# Patient Record
Sex: Male | Born: 1975 | Race: White | Hispanic: No | Marital: Married | Smoking: Current some day smoker
Health system: Southern US, Community
[De-identification: ages and names within clinical notes are randomized; demographics above are authoritative.]

## PROBLEM LIST (undated history)

## (undated) DIAGNOSIS — K219 Gastro-esophageal reflux disease without esophagitis: Secondary | ICD-10-CM

## (undated) DIAGNOSIS — B1920 Unspecified viral hepatitis C without hepatic coma: Secondary | ICD-10-CM

## (undated) HISTORY — DX: Unspecified viral hepatitis C without hepatic coma: B19.20

## (undated) HISTORY — PX: FINGER SURGERY: SHX640

---

## 2014-11-14 DIAGNOSIS — B1920 Unspecified viral hepatitis C without hepatic coma: Secondary | ICD-10-CM

## 2014-11-14 HISTORY — DX: Unspecified viral hepatitis C without hepatic coma: B19.20

## 2014-12-06 ENCOUNTER — Other Ambulatory Visit (HOSPITAL_COMMUNITY)
Admission: RE | Admit: 2014-12-06 | Discharge: 2014-12-06 | Disposition: A | Discharge status: Home / Self Care | Payer: Self-pay | Source: Ambulatory Visit | Attending: Gastroenterology | Admitting: Gastroenterology

## 2014-12-06 ENCOUNTER — Inpatient Hospital Stay (HOSPITAL_COMMUNITY)
Admission: AD | Admit: 2014-12-06 | Discharge: 2014-12-08 | DRG: 442 | Disposition: A | Discharge status: Home / Self Care | Payer: Self-pay | Source: Ambulatory Visit | Attending: Internal Medicine | Admitting: Internal Medicine

## 2014-12-06 ENCOUNTER — Ambulatory Visit (HOSPITAL_COMMUNITY)
Admission: RE | Admit: 2014-12-06 | Discharge: 2014-12-06 | Disposition: A | Discharge status: Home / Self Care | Payer: Self-pay | Source: Ambulatory Visit | Attending: Gastroenterology | Admitting: Gastroenterology

## 2014-12-06 ENCOUNTER — Ambulatory Visit (INDEPENDENT_AMBULATORY_CARE_PROVIDER_SITE_OTHER): Payer: Self-pay | Admitting: Gastroenterology

## 2014-12-06 ENCOUNTER — Encounter: Payer: Self-pay | Admitting: Gastroenterology

## 2014-12-06 VITALS — BP 103/68 | HR 56 | Temp 97.6°F | Ht 70.0 in | Wt 137.6 lb

## 2014-12-06 DIAGNOSIS — B1710 Acute hepatitis C without hepatic coma: Secondary | ICD-10-CM | POA: Insufficient documentation

## 2014-12-06 DIAGNOSIS — R7989 Other specified abnormal findings of blood chemistry: Secondary | ICD-10-CM

## 2014-12-06 DIAGNOSIS — R634 Abnormal weight loss: Secondary | ICD-10-CM | POA: Diagnosis present

## 2014-12-06 DIAGNOSIS — R109 Unspecified abdominal pain: Secondary | ICD-10-CM | POA: Diagnosis present

## 2014-12-06 DIAGNOSIS — R945 Abnormal results of liver function studies: Secondary | ICD-10-CM

## 2014-12-06 DIAGNOSIS — R1084 Generalized abdominal pain: Secondary | ICD-10-CM

## 2014-12-06 DIAGNOSIS — R17 Unspecified jaundice: Secondary | ICD-10-CM | POA: Insufficient documentation

## 2014-12-06 DIAGNOSIS — Z681 Body mass index (BMI) 19 or less, adult: Secondary | ICD-10-CM

## 2014-12-06 DIAGNOSIS — G8929 Other chronic pain: Secondary | ICD-10-CM | POA: Diagnosis present

## 2014-12-06 DIAGNOSIS — R1011 Right upper quadrant pain: Secondary | ICD-10-CM | POA: Insufficient documentation

## 2014-12-06 DIAGNOSIS — F17210 Nicotine dependence, cigarettes, uncomplicated: Secondary | ICD-10-CM | POA: Diagnosis present

## 2014-12-06 DIAGNOSIS — R1013 Epigastric pain: Secondary | ICD-10-CM

## 2014-12-06 LAB — HEPATIC FUNCTION PANEL
ALT: 1225 U/L — ABNORMAL HIGH (ref 0–53)
ALT: 1225 U/L — ABNORMAL HIGH (ref 0–53)
AST: 1015 U/L — ABNORMAL HIGH (ref 0–37)
AST: 1015 U/L — ABNORMAL HIGH (ref 0–37)
Albumin: 3.4 g/dL — ABNORMAL LOW (ref 3.5–5.2)
Albumin: 3.4 g/dL — ABNORMAL LOW (ref 3.5–5.2)
Alkaline Phosphatase: 573 U/L — ABNORMAL HIGH (ref 39–117)
Alkaline Phosphatase: 573 U/L — ABNORMAL HIGH (ref 39–117)
BILIRUBIN DIRECT: 9.2 mg/dL — AB (ref 0.0–0.5)
BILIRUBIN TOTAL: 15.6 mg/dL — AB (ref 0.3–1.2)
BILIRUBIN TOTAL: 15.6 mg/dL — AB (ref 0.3–1.2)
Bilirubin, Direct: 9.2 mg/dL — ABNORMAL HIGH (ref 0.0–0.3)
Indirect Bilirubin: 6.4 mg/dL — ABNORMAL HIGH (ref 0.3–0.9)
Indirect Bilirubin: 6.4 mg/dL — ABNORMAL HIGH (ref 0.2–1.2)
Total Protein: 7 g/dL (ref 6.0–8.3)
Total Protein: 7 g/dL (ref 6.0–8.3)

## 2014-12-06 LAB — CBC WITH DIFFERENTIAL/PLATELET
BASOS ABS: 0.5 10*3/uL — AB (ref 0.0–0.1)
BASOS PCT: 3 % — AB (ref 0–1)
Basophils Absolute: 0.5 10*3/uL — ABNORMAL HIGH (ref 0.0–0.1)
Basophils Relative: 3 % — ABNORMAL HIGH (ref 0–1)
EOS ABS: 1.4 10*3/uL — AB (ref 0.0–0.7)
EOS ABS: 1.4 10*3/uL — AB (ref 0.0–0.7)
Eosinophils Relative: 9 % — ABNORMAL HIGH (ref 0–5)
Eosinophils Relative: 9 % — ABNORMAL HIGH (ref 0–5)
HCT: 45.3 % (ref 39.0–52.0)
HCT: 45.3 % (ref 39.0–52.0)
HEMOGLOBIN: 15.8 g/dL (ref 13.0–17.0)
Hemoglobin: 15.8 g/dL (ref 13.0–17.0)
LYMPHS PCT: 36 % (ref 12–46)
Lymphocytes Relative: 36 % (ref 12–46)
Lymphs Abs: 5.6 10*3/uL — ABNORMAL HIGH (ref 0.7–4.0)
Lymphs Abs: 5.6 10*3/uL — ABNORMAL HIGH (ref 0.7–4.0)
MCH: 33.5 pg (ref 26.0–34.0)
MCH: 33.5 pg (ref 26.0–34.0)
MCHC: 34.9 g/dL (ref 30.0–36.0)
MCHC: 34.9 g/dL (ref 30.0–36.0)
MCV: 96 fL (ref 78.0–100.0)
MCV: 96 fL (ref 78.0–100.0)
MONO ABS: 1.4 10*3/uL — AB (ref 0.1–1.0)
Monocytes Absolute: 1.4 10*3/uL — ABNORMAL HIGH (ref 0.1–1.0)
Monocytes Relative: 9 % (ref 3–12)
Monocytes Relative: 9 % (ref 3–12)
Neutro Abs: 6.6 10*3/uL (ref 1.7–7.7)
Neutro Abs: 6.7 10*3/uL (ref 1.7–7.7)
Neutrophils Relative %: 43 % (ref 43–77)
Neutrophils Relative %: 43 % (ref 43–77)
PLATELETS: 307 10*3/uL (ref 150–400)
Platelets: 307 10*3/uL (ref 150–400)
RBC: 4.72 MIL/uL (ref 4.22–5.81)
RBC: 4.72 MIL/uL (ref 4.22–5.81)
RDW: 13.5 % (ref 11.5–15.5)
RDW: 13.5 % (ref 11.5–15.5)
WBC: 15.5 10*3/uL — AB (ref 4.0–10.5)
WBC: 15.5 10*3/uL — ABNORMAL HIGH (ref 4.0–10.5)

## 2014-12-06 LAB — CREATININE, SERUM: Creatinine, Ser: 0.6 mg/dL (ref 0.50–1.35)

## 2014-12-06 LAB — PROTIME-INR
INR: 1.05 (ref 0.00–1.49)
INR: 1.05 (ref <1.50)
PROTHROMBIN TIME: 13.8 s (ref 11.6–15.2)
PROTHROMBIN TIME: 13.8 s (ref 11.6–15.2)

## 2014-12-06 MED ORDER — IOHEXOL 300 MG/ML  SOLN
100.0000 mL | Freq: Once | INTRAMUSCULAR | Status: AC | PRN
  Administered 2014-12-06: 100 mL via INTRAVENOUS

## 2014-12-06 MED ORDER — HEPARIN SODIUM (PORCINE) 5000 UNIT/ML IJ SOLN
5000.0000 [IU] | Freq: Three times a day (TID) | INTRAMUSCULAR | Status: DC
  Administered 2014-12-06 – 2014-12-08 (×4): 5000 [IU] via SUBCUTANEOUS
  Filled 2014-12-06 (×4): qty 1

## 2014-12-06 MED ORDER — ONDANSETRON HCL 4 MG PO TABS
4.0000 mg | ORAL_TABLET | Freq: Four times a day (QID) | ORAL | Status: DC | PRN
  Administered 2014-12-07: 4 mg via ORAL
  Filled 2014-12-06 (×2): qty 1

## 2014-12-06 MED ORDER — TRAMADOL HCL 50 MG PO TABS
50.0000 mg | ORAL_TABLET | Freq: Four times a day (QID) | ORAL | Status: DC | PRN
  Administered 2014-12-06 – 2014-12-07 (×2): 50 mg via ORAL
  Filled 2014-12-06 (×2): qty 1

## 2014-12-06 MED ORDER — ONDANSETRON HCL 4 MG/2ML IJ SOLN
4.0000 mg | Freq: Four times a day (QID) | INTRAMUSCULAR | Status: DC | PRN
  Administered 2014-12-06 – 2014-12-08 (×4): 4 mg via INTRAVENOUS
  Filled 2014-12-06 (×4): qty 2

## 2014-12-06 MED ORDER — SODIUM CHLORIDE 0.9 % IJ SOLN
3.0000 mL | Freq: Two times a day (BID) | INTRAMUSCULAR | Status: DC
  Administered 2014-12-06 – 2014-12-08 (×3): 3 mL via INTRAVENOUS

## 2014-12-06 NOTE — Assessment & Plan Note (Addendum)
39 year old male with chronic generalized abdominal pain, early satiety, nausea, and associated weight loss, with findings of significantly elevated transaminases and Tbili 4.2 several days ago. No imaging performed by referring provider. No known history of liver disease. With risk factors for viral hepatitis, need to check Hep C antibody, Hep BsAg, HIV, CBC, repeat HFP. Needs CT abd with contrast stat today. May have underlying liver disease due to chronic ETOH abuse. Clinical picture not entirely clear until imaging completed. Start Nexium due to chronic GERD.   As of note, low-volume hematochezia intermittently will need further work-up via colonoscopy in the future. Address current issues first.

## 2014-12-06 NOTE — H&P (Signed)
[facility] History and Physical  Anthony Larson RUE:[id number] DOB: November 03, 1975 DOA: 12/06/2014  Referring physician: Gerrit Halls, NP. PCP: Pcp Not In System   Chief Complaint: Abdominal pain, nausea.  HPI: Anthony Larson is a 39 y.o. male  This is a 39 year old man who has been admitted directly by the gastroenterology team. He apparently has had lower abdominal pain for approximately 2 years with early satiety. He has associated nausea. He denies any significant vomiting, hematemesis. He has noticed dark urine. There is no diarrhea but stools are somewhat changed in color. He occasionally has had rectal bleeding. Overall, his symptoms of present for between one to 2 years. Approximately 5 years ago, he started to drink heavily alcohol, a bottle of vodka per day, but then he quit drinking 2 years ago. He has no history of IV drug abuse. He has several tattoos. There has been no diarrhea, fever. He has been investigated in the gastroenterology office and he is now being admitted because his liver enzymes are significantly elevated and his bilirubin is also elevated at 15.6. This is an increase from when blood work was done at an urgent care center recently. A CT scan has also been done which does not show any major abnormalities but there is some periportal edema and some small lymph nodes in the porta hepatis. He is now being admitted for further investigation.   Review of Systems:  Apart from symptoms above, all systems negative.  No past medical history on file. Past Surgical History  Procedure Laterality Date  . Finger surgery      left hand middle finger   Social History:  reports that he has been smoking Cigarettes.  He has been smoking about 0.50 packs per day. He does not have any smokeless tobacco history on file. He reports that he drinks alcohol. He reports that he uses illicit drugs.  No Known Allergies  Family History  Problem Relation Age of Onset  . Prostate cancer Father     . Colon cancer Neg Hx   . Throat cancer Mother       Prior to Admission medications   Medication Sig Start Date End Date Taking? Authorizing Provider  traMADol (ULTRAM) 50 MG tablet Take 50 mg by mouth every 6 (six) hours as needed.    Historical Provider, MD   Physical Exam: Filed Vitals:   12/06/14 1900  BP: 98/58  Pulse: 68  Temp: 99.4 F (37.4 C)  TempSrc: Oral  Resp: 18  Height:  (1.778 m)  Weight: 60 kg (132 lb 4.4 oz)  SpO2: 97%    Wt Readings from Last 3 Encounters:  12/06/14 60 kg (132 lb 4.4 oz)  12/06/14 62.415 kg (137 lb 9.6 oz)    General:  Appears jaundice. There is no clubbing. Eyes: PERRL, normal lids, irises & conjunctiva ENT: grossly normal hearing, lips & tongue Neck: no LAD, masses or thyromegaly Cardiovascular: RRR, no m/r/g. No LE edema. Telemetry: SR, no arrhythmias  Respiratory: CTA bilaterally, no w/r/r. Normal respiratory effort. Abdomen: soft, ntnd. No masses felt. No hepatomegaly. No splenomegaly. Skin: no rash or induration seen on limited exam Musculoskeletal: grossly normal tone BUE/BLE Psychiatric: grossly normal mood and affect, speech fluent and appropriate Neurologic: grossly non-focal.          Labs on Admission:  Basic Metabolic Panel: No results for input(s): NA, K, CL, CO2, GLUCOSE, BUN, CREATININE, CALCIUM, MG, PHOS in the last 168 hours. Liver Function Tests:  Recent Labs Lab 12/06/14 1200  12/06/14 1330  AST 1015* 1015*  ALT 1225* 1225*  ALKPHOS 573* 573*  BILITOT 15.6* 15.6*  PROT 7.0 7.0  ALBUMIN 3.4* 3.4*   No results for input(s): LIPASE, AMYLASE in the last 168 hours. No results for input(s): AMMONIA in the last 168 hours. CBC:  Recent Labs Lab 12/06/14 1200 12/06/14 1330  WBC 15.5* 15.5*  NEUTROABS 6.7 6.6  HGB 15.8 15.8  HCT 45.3 45.3  MCV 96.0 96.0  PLT 307 307   Cardiac Enzymes: No results for input(s): CKTOTAL, CKMB, CKMBINDEX, TROPONINI in the last 168 hours.  BNP (last 3  results) No results for input(s): BNP in the last 8760 hours.  ProBNP (last 3 results) No results for input(s): PROBNP in the last 8760 hours.  CBG: No results for input(s): GLUCAP in the last 168 hours.  Radiological Exams on Admission: Ct Abdomen Pelvis W Contrast  12/06/2014   CLINICAL DATA:  Elevated liver function tests, abdominal pain, jaundice  EXAM: CT ABDOMEN AND PELVIS WITH CONTRAST  TECHNIQUE: Multidetector CT imaging of the abdomen and pelvis was performed using the standard protocol following bolus administration of intravenous contrast.  CONTRAST:  100mL OMNIPAQUE IOHEXOL 300 MG/ML  SOLN  COMPARISON:  None.  FINDINGS: The lung bases are clear. The liver enhances with no focal abnormality and no ductal dilatation is seen. There is however some periportal edema this is a nonspecific finding but can be seen with hepatitis, congestive heart failure, or lymphatic obstruction in the porta hepatis. The portal vein appears to opacified. No focal hepatic abnormality is seen. The gallbladder is contracted and no calcified gallstones are seen. The pancreas appears normal in size and the pancreatic duct is not dilated. The peripancreatic fat planes are relatively well preserved with no evidence of pancreatitis. The adrenal glands and spleen are unremarkable. The stomach is filled with food debris. The kidneys enhance with no calculus or mass and no hydronephrosis is seen. On delayed images the pelvocaliceal systems are unremarkable and the proximal ureters are normal in caliber. The abdominal aorta is normal in caliber. A few lymph nodes are present in the epigastrium and porta hepatis which could be reactive.  The urinary bladder is decompressed and appears slightly thick walled. The prostate is within normal limits in size. The terminal ileum and the appendix are unremarkable. There is degenerative disc disease at the L5-S1 level noted.  IMPRESSION: 1. Periportal edema which is a nonspecific finding,  but can be seen with hepatitis, congestive heart failure, or obstruction to the lymphatic drainage from the porta hepatis by adenopathy. Correlate clinically. 2. Small nodes in the porta hepatis and epigastrium could be reactive. 3. No gallstones are evident. 4. Degenerative disc disease at L5-S1   Electronically Signed   By: Dwyane DeePaul  Barry M.D.   On: 12/06/2014 16:34    Assessment/Plan Active Problems:   Elevated LFTs   Abdominal pain   Abnormal LFTs   1. Abnormal liver function tests. He has jaundice which does not appear to be obstructive in nature and the liver enzymes point towards a hepatitic picture overall. Blood has been taking for hepatitis virology and HIV. I will add ferritin levels. He may eventually need a liver biopsy for further diagnosis. I appreciate gastroenterology consultation.  Further recommendations will depend on patient's hospital progress.   Code Status: Full code.   DVT Prophylaxis: Heparin.  Family Communication: I discussed the plan with the patient at the bedside.   Disposition Plan: Home when medically stable.  Time spent:  60 minutes.  Wilson SingerGOSRANI,NIMISH C [facility] Pager [phone].

## 2014-12-06 NOTE — Progress Notes (Signed)
No pcp per patient 

## 2014-12-06 NOTE — Progress Notes (Signed)
Primary Care Physician:  Pcp Not In System Primary Gastroenterologist:  Dr. Darrick Penna   Chief Complaint  Patient presents with  . Abdominal Pain  . Nausea    HPI:   Anthony Larson is a 39 y.o. male presenting today at the request of [facility]. He was seen on 11/29/14 and found to have ALT 691, AST 567, Tbili 4.2. No imaging studies were done.   Has had pain from neck down to lower abdomen for "years". Notes early satiety. Associated nausea. Feels like it is sitting heavy and just won't go "any further". Notes itching. Urine tea-colored, starting a few weeks ago. BMs look like "whatever you ate going out the same way". No significant changes in bowel habits. Notes occasional bright red blood in stool, sometimes dark/burgundy. Present for about a year. +severe reflux.   Notes epigastric burning. Constant. Sometimes worse than others. Sometimes worsened with eating, certain ways he sits. Used to weigh 143 about a week ago, now down to 137. Baseline weight 150 or 160.   History of chronic ETOH abuse. Daily ETOH use in the past. Could drink a bottle of vodka in a day. Would drink until blacking out. No IV drug use. Has several tattoos, friend did it with tattoo gun but not in a tattoo shop. Last time he drank about a month ago but was just 2 beers.    No past medical history on file.  Past Surgical History  Procedure Laterality Date  . Finger surgery      left hand middle finger    Current Outpatient Prescriptions  Medication Sig Dispense Refill  . traMADol (ULTRAM) 50 MG tablet Take 50 mg by mouth every 6 (six) hours as needed.     No current facility-administered medications for this visit.    Allergies as of 12/06/2014  . (No Known Allergies)    Family History  Problem Relation Age of Onset  . Prostate cancer Father   . Colon cancer Neg Hx   . Throat cancer Mother     History   Social History  . Marital Status: Unknown    Spouse Name: N/A  . Number of  Children: N/A  . Years of Education: N/A   Occupational History  . Not on file.   Social History Main Topics  . Smoking status: Current Some Day Smoker -- 0.50 packs/day    Types: Cigarettes  . Smokeless tobacco: Not on file  . Alcohol Use: 0.0 oz/week    0 Standard drinks or equivalent per week     Comment: history of ETOH abuse  . Drug Use: Yes     Comment: marijuana, no IV drug use  . Sexual Activity: Not on file   Other Topics Concern  . Not on file   Social History Narrative  . No narrative on file    Review of Systems: As mentioned in HPI  Physical Exam: BP 103/68 mmHg  Pulse 56  Temp(Src) 97.6 F (36.4 C) (Oral)  Ht  (1.778 m)  Wt 137 lb 9.6 oz (62.415 kg)  BMI 19.74 kg/m2 General:   Alert and oriented. Jaundiced. Appears cachectic. Sunken cheeks. Chronically-ill appearing.  Head:  Normocephalic and atraumatic. Eyes:  +scleral icterus  Ears:  Normal auditory acuity. Nose:  No deformity, discharge,  or lesions. Mouth:  No deformity or lesions, oral mucosa pink.  Lungs:  Clear to auscultation bilaterally. No wheezes, rales, or rhonchi. No distress.  Heart:  S1, S2 present  without murmurs appreciated.  Abdomen:  +BS, soft, non-tender and non-distended. No HSM noted. No guarding or rebound. No masses appreciated.  Rectal:  Deferred  Msk:  Symmetrical without gross deformities. Normal posture. Extremities:  Without  edema. Neurologic:  Alert and  oriented x4;  grossly normal neurologically. Negative asterixis  Skin:  Intact without significant lesions or rashes. Psych:  Alert and cooperative. Normal mood and affect.   Outside labs from 11/29/14: AST 567, ALT 691, Tbili 4.2. NO IMAGING COMPLETED.

## 2014-12-06 NOTE — Progress Notes (Signed)
Quick Note:  Reviewed blood work. Transaminases now in the 1000s, bilirubin 15.6. INR normal. Mixed pattern elevated LFTs. Hep C, Hep B, HIV pending. May ultimately need further serologies. CT with periportal edema. Discussed briefly with Dr. Darrick PennaFields. As LFTs worsening, needs admission for observation. Contacted patient, [place]. Reports abdominal pain, afebrile, willing to come for admission. Discussed with Dr. Karilyn CotaGosrani, who will be admitting hospitalist. He asked that the nursing staff contact him upon arrival. I contacted the 3rd floor and spoke with the unit secretary, who will let the nursing staff know. Patient was contacted via his cell phone and instructed to present to the [facility] admissions. ______

## 2014-12-06 NOTE — Patient Instructions (Signed)
Start taking the Nexium samples once daily, 30 minutes before breakfast.   I would like to get a CT today. We will call with the results.   Please have blood work done as well.  At some point, you will need a colonoscopy due to history of rectal bleeding. You may need an upper endoscopy depending on the findings from the imaging studies.

## 2014-12-07 DIAGNOSIS — B1710 Acute hepatitis C without hepatic coma: Principal | ICD-10-CM | POA: Insufficient documentation

## 2014-12-07 DIAGNOSIS — G8929 Other chronic pain: Secondary | ICD-10-CM

## 2014-12-07 DIAGNOSIS — R1011 Right upper quadrant pain: Secondary | ICD-10-CM

## 2014-12-07 DIAGNOSIS — R1013 Epigastric pain: Secondary | ICD-10-CM

## 2014-12-07 LAB — COMPREHENSIVE METABOLIC PANEL
ALK PHOS: 517 U/L — AB (ref 39–117)
ALT: 1126 U/L — AB (ref 0–53)
AST: 887 U/L — AB (ref 0–37)
Albumin: 3.1 g/dL — ABNORMAL LOW (ref 3.5–5.2)
Anion gap: 7 (ref 5–15)
BUN: 9 mg/dL (ref 6–23)
CO2: 30 mmol/L (ref 19–32)
Calcium: 8.8 mg/dL (ref 8.4–10.5)
Chloride: 100 mmol/L (ref 96–112)
Creatinine, Ser: 0.61 mg/dL (ref 0.50–1.35)
GLUCOSE: 102 mg/dL — AB (ref 70–99)
POTASSIUM: 4.1 mmol/L (ref 3.5–5.1)
Sodium: 137 mmol/L (ref 135–145)
Total Bilirubin: 13 mg/dL — ABNORMAL HIGH (ref 0.3–1.2)
Total Protein: 6.3 g/dL (ref 6.0–8.3)

## 2014-12-07 LAB — HEPATITIS A ANTIBODY, TOTAL: Hep A Total Ab: NONREACTIVE

## 2014-12-07 LAB — ACETAMINOPHEN LEVEL

## 2014-12-07 LAB — HIV ANTIBODY (ROUTINE TESTING W REFLEX)
HIV 1&2 Ab, 4th Generation: NONREACTIVE
HIV SCREEN 4TH GENERATION: NONREACTIVE

## 2014-12-07 LAB — PROTIME-INR
INR: 1.1 (ref 0.00–1.49)
PROTHROMBIN TIME: 14.3 s (ref 11.6–15.2)

## 2014-12-07 LAB — HEPATITIS B SURFACE ANTIGEN
HEP B S AG: NEGATIVE
Hepatitis B Surface Ag: NEGATIVE

## 2014-12-07 LAB — HEPATITIS A ANTIBODY, IGM: HEP A IGM: NONREACTIVE

## 2014-12-07 LAB — HEPATITIS C RNA QUANTITATIVE

## 2014-12-07 LAB — FERRITIN: Ferritin: 1212 ng/mL — ABNORMAL HIGH (ref 22–322)

## 2014-12-07 LAB — HEPATITIS C ANTIBODY
HCV Ab: REACTIVE — AB
HCV Ab: REACTIVE — AB

## 2014-12-07 MED ORDER — PANTOPRAZOLE SODIUM 40 MG PO TBEC
40.0000 mg | DELAYED_RELEASE_TABLET | Freq: Every day | ORAL | Status: DC
  Administered 2014-12-07 – 2014-12-08 (×2): 40 mg via ORAL
  Filled 2014-12-07 (×2): qty 1

## 2014-12-07 MED ORDER — HYDROXYZINE HCL 25 MG PO TABS
25.0000 mg | ORAL_TABLET | Freq: Three times a day (TID) | ORAL | Status: DC | PRN
Start: 2014-12-07 — End: 2014-12-08

## 2014-12-07 MED ORDER — SODIUM CHLORIDE 0.9 % IV SOLN
INTRAVENOUS | Status: DC
  Administered 2014-12-07 – 2014-12-08 (×2): via INTRAVENOUS

## 2014-12-07 MED ORDER — HYDROMORPHONE HCL 1 MG/ML IJ SOLN
1.0000 mg | INTRAMUSCULAR | Status: DC | PRN
  Administered 2014-12-07 – 2014-12-08 (×7): 1 mg via INTRAVENOUS
  Filled 2014-12-07 (×7): qty 1

## 2014-12-07 NOTE — Care Management Note (Addendum)
    Page 1 of 1   12/08/2014     11:23:49 AM CARE MANAGEMENT NOTE 12/08/2014  Patient:  Anthony EatonBRANCH,Anthony Larson   Account Number:  [id number]  Date Initiated:  12/07/2014  Documentation initiated by:  Kathyrn SheriffHILDRESS,JESSICA  Subjective/Objective Assessment:   Pt is from home, independnet at baseline. Pt is uninsured and unemployeed. Pt has children living in the home. Pt has no PCP. Pt admitted for hepatitis and abd pain.     Action/Plan:   Pt plans to discharge home with self care. Referral made to financial counselor. [facility]chr[facility]. Will f/u with GI also. Will cont to follow for CM needs.   Anticipated DC Date:  12/08/2014   Anticipated DC Plan:  HOME/SELF CARE  In-house referral  Financial Counselor      DC Planning Services  CM consult      Choice offered to / List presented to:             Status of service:  Completed, signed off Medicare Important Message given?   (If response is "NO", the following Medicare IM given date fields will be blank) Date Medicare IM given:   Medicare IM given by:   Date Additional Medicare IM given:   Additional Medicare IM given by:    Discharge Disposition:  HOME/SELF CARE  Per UR Regulation:  Reviewed for med. necessity/level of care/duration of stay  If discussed at Long Length of Stay Meetings, dates discussed:    Comments:  12/08/2014 1100 Kathyrn SheriffJessica Childress, RN, MSN, CM Pt plans for discharge home today. [place] [facility] [place] Says pt needs to come in (no appointment needed) and fill out work eligability paperwork and appointment will be made. Pt given phone number and address. Pt states he will go. Financial counselor consulted and has visited with pt. No further CM needs.  12/07/2014 1500 Kathyrn SheriffJessica Childress, RN, MSN, CM

## 2014-12-07 NOTE — Progress Notes (Addendum)
Subjective:  Complains of chronic back pain and chronic abdominal pain. Pain currently not managed on Ultram. Previously on chronic narcotics prescribed but hasn't been seen by physician in awhile. More recently had been taking a friends Roxicet. Undisclosed amount. Also known to take a significant amount of BCs/Goodys as well. Took a box in the last few weeks. Chronic postprandial abdominal pain now worse in the last several weeks. No melena, brbpr, vomiting. Admits to tattoos not done in a facility, but the newest one was done 3 years ago. He has snorted within the past few months and shared. No IV drug use. Multiple close contacts have had Hep C.   Objective: Vital signs in last 24 hours: Temp:  [97.6 F (36.4 C)-99.4 F (37.4 C)] 98.4 F (36.9 C) (02/24 0546) Pulse Rate:  [53-68] 53 (02/24 0546) Resp:  [18] 18 (02/24 0546) BP: (95-103)/(49-68) 95/49 mmHg (02/24 0546) SpO2:  [97 %] 97 % (02/24 0546) Weight:  [132 lb 4.4 oz (60 kg)-137 lb 9.6 oz (62.415 kg)] 132 lb 4.4 oz (60 kg) (02/23 1900) Last BM Date: 12/06/14 General:   Alert,  Well-developed, well-nourished, pleasant and cooperative in NAD Head:  Normocephalic and atraumatic. Eyes:  Sclera clear, + icterus.  Abdomen:  Soft, moderate epigastric/ruq tenderness and nondistended.  Extremities:  Without clubbing, deformity or edema. Neurologic:  Alert and  oriented x4;  grossly normal neurologically. Skin:  Intact without significant lesions or rashes.+jaundice Psych:  Alert and cooperative. Normal mood and affect.  Intake/Output from previous day:   Intake/Output this shift:    Lab Results: CBC  Recent Labs  12/06/14 1200 12/06/14 1330  WBC 15.5* 15.5*  HGB 15.8 15.8  HCT 45.3 45.3  MCV 96.0 96.0  PLT 307 307   BMET  Recent Labs  12/06/14 2032 12/07/14 0547  NA  --  137  K  --  4.1  CL  --  100  CO2  --  30  GLUCOSE  --  102*  BUN  --  9  CREATININE 0.60 0.61  CALCIUM  --  8.8   LFTs  Recent Labs   12/06/14 1200 12/06/14 1330 12/07/14 0547  BILITOT 15.6* 15.6* 13.0*  BILIDIR 9.2* 9.2*  --   IBILI 6.4* 6.4*  --   ALKPHOS 573* 573* 517*  AST 1015* 1015* 887*  ALT 1225* 1225* 1126*  PROT 7.0 7.0 6.3  ALBUMIN 3.4* 3.4* 3.1*   No results for input(s): LIPASE in the last 72 hours. PT/INR  Recent Labs  12/06/14 1200 12/06/14 1330 12/07/14 0547  LABPROT 13.8 13.8 14.3  INR 1.05 1.05 1.10      Imaging Studies: Ct Abdomen Pelvis W Contrast  12/06/2014   CLINICAL DATA:  Elevated liver function tests, abdominal pain, jaundice  EXAM: CT ABDOMEN AND PELVIS WITH CONTRAST  TECHNIQUE: Multidetector CT imaging of the abdomen and pelvis was performed using the standard protocol following bolus administration of intravenous contrast.  CONTRAST:  100mL OMNIPAQUE IOHEXOL 300 MG/ML  SOLN  COMPARISON:  None.  FINDINGS: The lung bases are clear. The liver enhances with no focal abnormality and no ductal dilatation is seen. There is however some periportal edema this is a nonspecific finding but can be seen with hepatitis, congestive heart failure, or lymphatic obstruction in the porta hepatis. The portal vein appears to opacified. No focal hepatic abnormality is seen. The gallbladder is contracted and no calcified gallstones are seen. The pancreas appears normal in size and the pancreatic duct is not dilated.  The peripancreatic fat planes are relatively well preserved with no evidence of pancreatitis. The adrenal glands and spleen are unremarkable. The stomach is filled with food debris. The kidneys enhance with no calculus or mass and no hydronephrosis is seen. On delayed images the pelvocaliceal systems are unremarkable and the proximal ureters are normal in caliber. The abdominal aorta is normal in caliber. A few lymph nodes are present in the epigastrium and porta hepatis which could be reactive.  The urinary bladder is decompressed and appears slightly thick walled. The prostate is within normal limits  in size. The terminal ileum and the appendix are unremarkable. There is degenerative disc disease at the L5-S1 level noted.  IMPRESSION: 1. Periportal edema which is a nonspecific finding, but can be seen with hepatitis, congestive heart failure, or obstruction to the lymphatic drainage from the porta hepatis by adenopathy. Correlate clinically. 2. Small nodes in the porta hepatis and epigastrium could be reactive. 3. No gallstones are evident. 4. Degenerative disc disease at L5-S1   Electronically Signed   By: Dwyane Dee M.D.   On: 12/06/2014 16:34  [2 weeks]   Assessment: 39 y/o male with generalizes abdominal pain, early satiety, nausea, weight loss with significantly abnormal LFTs. CT with nonspecific periportal edema. H/O etoh abuse but none in couple of months at least. +risk factors for acute viral hepatitis. +HCV Antibody but no baseline to determine if acute or chronic. LFTs have shown some trend downward. Patient with oxycodone/acetaminophen ingestion but unclear quantities.   Chronic abdominal pain in setting of significant ASA powder use. Cannot exclude PUD.    Plan: 1. Check acetaminophen levels.  2. Pain management addressed acutely this morning but patient aware that hospitalist will be managing during his stay. 3. Await pending labs.  4. Consider EGD as outpatient if ongoing epigastric pain. Will start PPI therapy.   Leanna Battles. Dixon Boos [facility] [phone] 2/24/20169:44 AM     LOS: 1 day    Attending note:  Acetaminophen level less than 10. Discussed with Dr. Kerry Hough.  Viral markers pending. He does not appear to be running a fulminant course. Once he is eating better, he can probably be discharged.

## 2014-12-07 NOTE — Care Management Utilization Note (Signed)
UR completed 

## 2014-12-07 NOTE — Progress Notes (Signed)
[facility] PROGRESS NOTE  Anthony Larson ZOX:[id number] DOB: 1976/05/13 DOA: 12/06/2014 PCP: Pcp Not In System  Assessment/Plan: 1. Elevated liver enzymes. Possibly related to viral hepatitis. Hepatitis C antibody is positive. Treatment at this time would be supportive. His liver enzymes appear to be trending down. Continue with IV fluids and supportive treatment. Acetaminophen and salicylate levels were negative. He will need outpatient GI follow-up for further workup/treatment. 2. Chronic abdominal pain. Continue supportive management.  Code Status: full code Family Communication: discussed with patient Disposition Plan: discharge home once tolerating po diet   Consultants:  gastroenterology  Procedures:    Antibiotics:    HPI/Subjective: Still feeling nauseous and having abdominal pain. No vomiting  Objective: Filed Vitals:   12/07/14 1514  BP: 94/53  Pulse: 46  Temp: 98.3 F (36.8 C)  Resp: 18    Intake/Output Summary (Last 24 hours) at 12/07/14 1819 Last data filed at 12/07/14 1330  Gross per 24 hour  Intake    240 ml  Output      0 ml  Net    240 ml   Filed Weights   12/06/14 1900  Weight: 60 kg (132 lb 4.4 oz)    Exam:   General:  NAD  Cardiovascular: S1, S2 RRR  Respiratory: CTA B  Abdomen: soft, epigastric tenderness, bs+  Musculoskeletal: no edema b/l   Data Reviewed: Basic Metabolic Panel:  Recent Labs Lab 12/06/14 2032 12/07/14 0547  NA  --  137  K  --  4.1  CL  --  100  CO2  --  30  GLUCOSE  --  102*  BUN  --  9  CREATININE 0.60 0.61  CALCIUM  --  8.8   Liver Function Tests:  Recent Labs Lab 12/06/14 12/06/14 1330 12/07/14 0547  AST 1015* 1015* 887*  ALT 1225* 1225* 1126*  ALKPHOS 573* 573* 517*  BILITOT 15.6* 15.6* 13.0*  PROT 7.0 7.0 6.3  ALBUMIN 3.4* 3.4* 3.1*   No results for input(s): LIPASE, AMYLASE in the last 168 hours. No results for input(s): AMMONIA in the last 168 hours. CBC:  Recent Labs Lab  12/06/14 12/06/14 1330  WBC 15.5* 15.5*  NEUTROABS 6.7 6.6  HGB 15.8 15.8  HCT 45.3 45.3  MCV 96.0 96.0  PLT 307 307   Cardiac Enzymes: No results for input(s): CKTOTAL, CKMB, CKMBINDEX, TROPONINI in the last 168 hours. BNP (last 3 results) No results for input(s): BNP in the last 8760 hours.  ProBNP (last 3 results) No results for input(s): PROBNP in the last 8760 hours.  CBG: No results for input(s): GLUCAP in the last 168 hours.  No results found for this or any previous visit (from the past 240 hour(s)).   Studies: Ct Abdomen Pelvis W Contrast  12/06/2014   CLINICAL DATA:  Elevated liver function tests, abdominal pain, jaundice  EXAM: CT ABDOMEN AND PELVIS WITH CONTRAST  TECHNIQUE: Multidetector CT imaging of the abdomen and pelvis was performed using the standard protocol following bolus administration of intravenous contrast.  CONTRAST:  OMNIPAQUE IOHEXOL 300 MG/ML  SOLN  COMPARISON:  None.  FINDINGS: The lung bases are clear. The liver enhances with no focal abnormality and no ductal dilatation is seen. There is however some periportal edema this is a nonspecific finding but can be seen with hepatitis, congestive heart failure, or lymphatic obstruction in the porta hepatis. The portal vein appears to opacified. No focal hepatic abnormality is seen. The gallbladder is contracted and no calcified gallstones are  seen. The pancreas appears normal in size and the pancreatic duct is not dilated. The peripancreatic fat planes are relatively well preserved with no evidence of pancreatitis. The adrenal glands and spleen are unremarkable. The stomach is filled with food debris. The kidneys enhance with no calculus or mass and no hydronephrosis is seen. On delayed images the pelvocaliceal systems are unremarkable and the proximal ureters are normal in caliber. The abdominal aorta is normal in caliber. A few lymph nodes are present in the epigastrium and porta hepatis which could be  reactive.  The urinary bladder is decompressed and appears slightly thick walled. The prostate is within normal limits in size. The terminal ileum and the appendix are unremarkable. There is degenerative disc disease at the L5-S1 level noted.  IMPRESSION: 1. Periportal edema which is a nonspecific finding, but can be seen with hepatitis, congestive heart failure, or obstruction to the lymphatic drainage from the porta hepatis by adenopathy. Correlate clinically. 2. Small nodes in the porta hepatis and epigastrium could be reactive. 3. No gallstones are evident. 4. Degenerative disc disease at L5-S1   Electronically Signed   By: Dwyane DeePaul  Barry M.D.   On: 12/06/2014 16:34    Scheduled Meds: . heparin  5,000 Units Subcutaneous 3 times per day  . pantoprazole  40 mg Oral QAC breakfast  . sodium chloride  3 mL Intravenous Q12H   Continuous Infusions:   Active Problems:   Abdominal pain   Elevated LFTs   Abnormal LFTs   RUQ pain   Abdominal pain, chronic, epigastric    Time spent: 30mins    MEMON,JEHANZEB  [facility] Pager [phone]. If 7PM-7AM, please contact night-coverage at [email or web address], password [email or web address] 12/07/2014, 6:19 PM  LOS: 1 day

## 2014-12-08 ENCOUNTER — Other Ambulatory Visit: Payer: Self-pay

## 2014-12-08 ENCOUNTER — Telehealth: Payer: Self-pay | Admitting: Gastroenterology

## 2014-12-08 DIAGNOSIS — R945 Abnormal results of liver function studies: Secondary | ICD-10-CM

## 2014-12-08 DIAGNOSIS — R7989 Other specified abnormal findings of blood chemistry: Secondary | ICD-10-CM

## 2014-12-08 LAB — CBC
HEMATOCRIT: 41 % (ref 39.0–52.0)
Hemoglobin: 14 g/dL (ref 13.0–17.0)
MCH: 32.9 pg (ref 26.0–34.0)
MCHC: 34.1 g/dL (ref 30.0–36.0)
MCV: 96.2 fL (ref 78.0–100.0)
Platelets: 313 10*3/uL (ref 150–400)
RBC: 4.26 MIL/uL (ref 4.22–5.81)
RDW: 13.9 % (ref 11.5–15.5)
WBC: 13.2 10*3/uL — ABNORMAL HIGH (ref 4.0–10.5)

## 2014-12-08 LAB — HEPATIC FUNCTION PANEL
ALBUMIN: 2.9 g/dL — AB (ref 3.5–5.2)
ALT: 1191 U/L — ABNORMAL HIGH (ref 0–53)
AST: 978 U/L — ABNORMAL HIGH (ref 0–37)
Alkaline Phosphatase: 438 U/L — ABNORMAL HIGH (ref 39–117)
BILIRUBIN TOTAL: 11.8 mg/dL — AB (ref 0.3–1.2)
Bilirubin, Direct: 7.4 mg/dL — ABNORMAL HIGH (ref 0.0–0.5)
Indirect Bilirubin: 4.4 mg/dL — ABNORMAL HIGH (ref 0.3–0.9)
Total Protein: 6.1 g/dL (ref 6.0–8.3)

## 2014-12-08 LAB — PROTIME-INR
INR: 1.08 (ref 0.00–1.49)
PROTHROMBIN TIME: 14.1 s (ref 11.6–15.2)

## 2014-12-08 LAB — HCV RNA QUANT
HCV Quantitative Log: 7.84 {Log} — ABNORMAL HIGH (ref <1.18)
HCV Quantitative: 69516598 IU/mL — ABNORMAL HIGH (ref <15)

## 2014-12-08 LAB — HCV RNA QUANT RFLX ULTRA OR GENOTYP
HCV QUANT LOG: 7.58 {Log} — AB (ref <1.18)
HCV Quantitative: 37755558 IU/mL — ABNORMAL HIGH (ref <15)

## 2014-12-08 MED ORDER — OXYCODONE HCL 5 MG PO TABS: 5.0000 mg | ORAL_TABLET | ORAL | Status: DC | PRN

## 2014-12-08 MED ORDER — PANTOPRAZOLE SODIUM 40 MG PO TBEC: 40.0000 mg | DELAYED_RELEASE_TABLET | Freq: Every day | ORAL | Status: DC

## 2014-12-08 MED ORDER — OXYCODONE HCL 5 MG PO TABS
5.0000 mg | ORAL_TABLET | ORAL | Status: DC | PRN
Start: 2014-12-08 — End: 2014-12-08

## 2014-12-08 NOTE — Plan of Care (Signed)
Problem: Discharge Progression Outcomes Goal: Other Discharge Outcomes/Goals Outcome: Completed/Met Date Met:  12/08/14 Pt received information on Hep C

## 2014-12-08 NOTE — Progress Notes (Addendum)
Subjective:  Patient tolerating diet. Multiple complaints including neck pain/headache of several months duration. Complains of itching. Chronic abdominal pain persists.   Objective: Vital signs in last 24 hours: Temp:  [98.3 F (36.8 C)-98.7 F (37.1 C)] 98.7 F (37.1 C) (02/25 0604) Pulse Rate:  [44-55] 44 (02/25 0604) Resp:  [18] 18 (02/25 0604) BP: (93-96)/(50-53) 93/50 mmHg (02/25 0604) SpO2:  [96 %-98 %] 97 % (02/25 0604) Last BM Date: 12/06/14 General:   Alert,  Well-developed, well-nourished, pleasant and cooperative in NAD. Accompanied by wife. Head:  Normocephalic and atraumatic. Eyes:  Sclera clear, + icterus.   Abdomen:  Soft, moderate epig/ruq tenderness and nondistended. Normal bowel sounds, without guarding, and without rebound.   Extremities:  Without clubbing, deformity or edema. Neurologic:  Alert and  oriented x4;  grossly normal neurologically. Skin:  Intact without significant lesions or rashes. +jaundice. Psych:  Alert and cooperative. Normal mood and affect.  Intake/Output from previous day: 02/24 0701 - 02/25 0700 In: 1091 [P.O.:240; I.V.:851] Out: -  Intake/Output this shift:    Lab Results: CBC  Recent Labs  12/06/14 12/06/14 1330 12/08/14 0539  WBC 15.5* 15.5* 13.2*  HGB 15.8 15.8 14.0  HCT 45.3 45.3 41.0  MCV 96.0 96.0 96.2  PLT 307 307 313   BMET  Recent Labs  12/06/14 2032 12/07/14 0547  NA  --  137  K  --  4.1  CL  --  100  CO2  --  30  GLUCOSE  --  102*  BUN  --  9  CREATININE 0.60 0.61  CALCIUM  --  8.8   LFTs  Recent Labs  12/06/14 12/06/14 1330 12/07/14 0547 12/08/14 0539  BILITOT 15.6* 15.6* 13.0* 11.8*  BILIDIR 9.2* 9.2*  --  7.4*  IBILI 6.4* 6.4*  --  4.4*  ALKPHOS 573* 573* 517* 438*  AST 1015* 1015* 887* 978*  ALT 1225* 1225* 1126* 1191*  PROT 7.0 7.0 6.3 6.1  ALBUMIN 3.4* 3.4* 3.1* 2.9*   No results for input(s): LIPASE in the last 72 hours. PT/INR  Recent Labs  12/06/14 1330 12/07/14 0547  12/08/14 0539  LABPROT 13.8 14.3 14.1  INR 1.05 1.10 1.08      Imaging Studies: Ct Abdomen Pelvis W Contrast  12/06/2014   CLINICAL DATA:  Elevated liver function tests, abdominal pain, jaundice  EXAM: CT ABDOMEN AND PELVIS WITH CONTRAST  TECHNIQUE: Multidetector CT imaging of the abdomen and pelvis was performed using the standard protocol following bolus administration of intravenous contrast.  CONTRAST:  100mL OMNIPAQUE IOHEXOL 300 MG/ML  SOLN  COMPARISON:  None.  FINDINGS: The lung bases are clear. The liver enhances with no focal abnormality and no ductal dilatation is seen. There is however some periportal edema this is a nonspecific finding but can be seen with hepatitis, congestive heart failure, or lymphatic obstruction in the porta hepatis. The portal vein appears to opacified. No focal hepatic abnormality is seen. The gallbladder is contracted and no calcified gallstones are seen. The pancreas appears normal in size and the pancreatic duct is not dilated. The peripancreatic fat planes are relatively well preserved with no evidence of pancreatitis. The adrenal glands and spleen are unremarkable. The stomach is filled with food debris. The kidneys enhance with no calculus or mass and no hydronephrosis is seen. On delayed images the pelvocaliceal systems are unremarkable and the proximal ureters are normal in caliber. The abdominal aorta is normal in caliber. A few lymph nodes are present in the epigastrium  and porta hepatis which could be reactive.  The urinary bladder is decompressed and appears slightly thick walled. The prostate is within normal limits in size. The terminal ileum and the appendix are unremarkable. There is degenerative disc disease at the L5-S1 level noted.  IMPRESSION: 1. Periportal edema which is a nonspecific finding, but can be seen with hepatitis, congestive heart failure, or obstruction to the lymphatic drainage from the porta hepatis by adenopathy. Correlate clinically. 2.  Small nodes in the porta hepatis and epigastrium could be reactive. 3. No gallstones are evident. 4. Degenerative disc disease at L5-S1   Electronically Signed   By: Dwyane Dee M.D.   On: 12/06/2014 16:34  [2 weeks]   Assessment: 39 y/o male with generalizes abdominal pain, early satiety, nausea, weight loss with significantly abnormal LFTs. CT with nonspecific periportal edema. H/O etoh abuse but none in couple of months at least. +risk factors for acute viral hepatitis. +HCV Antibody but no baseline to determine if acute or chronic. Suspect acute HCV. LFTs have shown some trend downward. He is tolerating his diet. Complains of acute on chronic pain.  Chronic abdominal pain in setting of significant ASA powder use. Cannot exclude PUD.  Multiple non-GI complaints including neck pain/headache which has been occuring for months and unlikely solely related to HCV. Describes history of trauma. Recommend he discuss with the hospitalist today. Patient has no insurance and is going [facility] be [facility] [place] [facility] case manager. Trying to see if he qualifies for assistance.   Plan: 1. F/U pending labs. 2. Consider outpatient EGD if ongoing epigastric pain. Continue PPI as outpatient.   3. He can be discharged when he is able to manage his pain at home. He will not take the tramadol, stating it upsets his stomach and doesn't work. Consider switching to oxycodone. Avoid tylenol.  4. He will need to have LFTs, PT/INR next week. Plan to see him back in our office in 2-3 weeks.  5. Discussed at length with his wife. She needs baseline HCV Ab. If negative, consider rechecking later this fall. She will discuss with her PCP.  6. HCV literature to be provided.   Leanna Battles. [facility] [phone] 2/25/20169:24 AM     LOS: 2 days

## 2014-12-08 NOTE — Discharge Summary (Signed)
Physician Discharge Summary  Anthony Larson NWG:[id number] DOB: [birth year] DOA: 12/06/2014  PCP: Pcp Not In System  Admit date: 12/06/2014 Discharge date: 12/08/2014  Time spent: 40 minutes  Recommendations for Outpatient Follow-up:  1. 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 He was seen at his gastroenterologist office and was found to have significantly elevated liver enzymes. He was directly admitted to the hospital. He underwent workup including CT of the abdomen and pelvis that indicated periportal edema, but no other significant findings. Viral markers were checked and returned positive for hepatitis C. This is likely the cause of his elevated liver enzymes. Patient was monitored in the hospital and his liver enzymes have appeared to stabilize. Although they are still elevated, they appeared to be stable. He has been cleared for discharge by gastroenterology service to have outpatient follow-up and repeat enzymes in 1 week. Patient is tolerating a solid diet. His pain appears to be reasonably controlled. He is ambulating in the halls without difficulty.  Procedures:    Consultations:  gastroenterology  Discharge Exam: Filed Vitals:   12/08/14 0900  BP: 94/54  Pulse: 48  Temp: 98.2 F (36.8 C)  Resp: 24    General: NAD Cardiovascular: S1, S2 RRR Respiratory: CTA B  Discharge Instructions   Discharge Instructions    Call MD for:  persistant nausea and vomiting    Complete by:  As  directed      Call MD for:  severe uncontrolled pain    Complete by:  As directed      Call MD for:  temperature >100.4    Complete by:  As directed      Diet - low sodium heart healthy    Complete by:  As directed      Increase activity slowly    Complete by:  As directed           Discharge Medication List as of 12/08/2014  2:41 PM    START taking these medications   Details  oxyCODONE (OXY IR/ROXICODONE) 5 MG immediate release tablet Take 1 tablet (5 mg total) by mouth every 4 (four) hours as needed for severe pain., Starting 12/08/2014, Until Discontinued, Print    pantoprazole (PROTONIX) 40 MG tablet Take 1 tablet (40 mg total) by mouth daily before breakfast., Starting 12/08/2014, Until Discontinued, Print       No Known Allergies    The results of significant diagnostics from this hospitalization (including imaging, microbiology, ancillary and laboratory) are listed below for reference.    Significant Diagnostic Studies: Ct Abdomen Pelvis W Contrast  12/06/2014   CLINICAL DATA:  Elevated liver function tests, abdominal pain, jaundice  EXAM: CT ABDOMEN AND PELVIS WITH CONTRAST  TECHNIQUE: Multidetector CT imaging of the abdomen and pelvis was performed using the standard protocol following bolus administration of intravenous contrast.  CONTRAST:  OMNIPAQUE IOHEXOL 300 MG/ML  SOLN  COMPARISON:  None.  FINDINGS: The lung bases are clear. The liver enhances with  no focal abnormality and no ductal dilatation is seen. There is however some periportal edema this is a nonspecific finding but can be seen with hepatitis, congestive heart failure, or lymphatic obstruction in the porta hepatis. The portal vein appears to opacified. No focal hepatic abnormality is seen. The gallbladder is contracted and no calcified gallstones are seen. The pancreas appears normal in size and the pancreatic duct is not dilated. The peripancreatic fat planes are relatively well preserved with no evidence  of pancreatitis. The adrenal glands and spleen are unremarkable. The stomach is filled with food debris. The kidneys enhance with no calculus or mass and no hydronephrosis is seen. On delayed images the pelvocaliceal systems are unremarkable and the proximal ureters are normal in caliber. The abdominal aorta is normal in caliber. A few lymph nodes are present in the epigastrium and porta hepatis which could be reactive.  The urinary bladder is decompressed and appears slightly thick walled. The prostate is within normal limits in size. The terminal ileum and the appendix are unremarkable. There is degenerative disc disease at the L5-S1 level noted.  IMPRESSION: 1. Periportal edema which is a nonspecific finding, but can be seen with hepatitis, congestive heart failure, or obstruction to the lymphatic drainage from the porta hepatis by adenopathy. Correlate clinically. 2. Small nodes in the porta hepatis and epigastrium could be reactive. 3. No gallstones are evident. 4. Degenerative disc disease at L5-S1   Electronically Signed   By: Dwyane DeePaul  Barry M.D.   On: 12/06/2014 16:34    Microbiology: No results found for this or any previous visit (from the past 240 hour(s)).   Labs: Basic Metabolic Panel:  Recent Labs Lab 12/06/14 2032 12/07/14 0547  NA  --  137  K  --  4.1  CL  --  100  CO2  --  30  GLUCOSE  --  102*  BUN  --  9  CREATININE 0.60 0.61  CALCIUM  --  8.8   Liver Function Tests:  Recent Labs Lab 12/06/14 12/06/14 1330 12/07/14 0547 12/08/14 0539  AST 1015* 1015* 887* 978*  ALT 1225* 1225* 1126* 1191*  ALKPHOS 573* 573* 517* 438*  BILITOT 15.6* 15.6* 13.0* 11.8*  PROT 7.0 7.0 6.3 6.1  ALBUMIN 3.4* 3.4* 3.1* 2.9*   No results for input(s): LIPASE, AMYLASE in the last 168 hours. No results for input(s): AMMONIA in the last 168 hours. CBC:  Recent Labs Lab 12/06/14 12/06/14 1330 12/08/14 0539  WBC 15.5* 15.5* 13.2*  NEUTROABS 6.7 6.6  --   HGB 15.8 15.8 14.0  HCT 45.3  45.3 41.0  MCV 96.0 96.0 96.2  PLT 307 307 313   Cardiac Enzymes: No results for input(s): CKTOTAL, CKMB, CKMBINDEX, TROPONINI in the last 168 hours. BNP: BNP (last 3 results) No results for input(s): BNP in the last 8760 hours.  ProBNP (last 3 results) No results for input(s): PROBNP in the last 8760 hours.  CBG: No results for input(s): GLUCAP in the last 168 hours.     Signed:  Saurav Crumble  [facility] 12/08/2014, 7:38 PM

## 2014-12-08 NOTE — Care Management Utilization Note (Signed)
UR completed 

## 2014-12-08 NOTE — Telephone Encounter (Signed)
Patient needs LFTs, PT/INR next week. Hospital follow up appointment in 2-3 weeks.

## 2014-12-08 NOTE — Telephone Encounter (Signed)
Pt is aware lab orders have been faxed to [facility] and he will go med week next week. [facility]

## 2014-12-09 LAB — HEPATITIS C GENOTYPE: HCV Genotype: 3

## 2014-12-14 ENCOUNTER — Telehealth: Payer: Self-pay

## 2014-12-14 NOTE — Telephone Encounter (Signed)
We don't provide narcotics for this. Needs to take the PPI. Actually increase it to twice a day. (Protonix BID).   Will discuss need for pain management referral at next appt. He will need further work-up before pain management is completed.

## 2014-12-14 NOTE — Telephone Encounter (Signed)
Pt came by office inquiring about a pain management referral. Pt states that some one at the hospital said he would be referred.  Unable to find referral not in computer. Pt has an appointment on 12/29/14 with Gerrit HallsAnna Sams, NP.   Pt also states that he needs something for pain to get him through to his appt.

## 2014-12-14 NOTE — Telephone Encounter (Signed)
Pt aware.

## 2014-12-15 LAB — HEPATIC FUNCTION PANEL
ALT: 850 U/L — AB (ref 0–53)
AST: 616 U/L — ABNORMAL HIGH (ref 0–37)
Albumin: 3.4 g/dL — ABNORMAL LOW (ref 3.5–5.2)
Alkaline Phosphatase: 496 U/L — ABNORMAL HIGH (ref 39–117)
BILIRUBIN INDIRECT: 4 mg/dL — AB (ref 0.2–1.2)
BILIRUBIN TOTAL: 9 mg/dL — AB (ref 0.2–1.2)
Bilirubin, Direct: 5 mg/dL — ABNORMAL HIGH (ref 0.0–0.3)
Total Protein: 6 g/dL (ref 6.0–8.3)

## 2014-12-15 LAB — PROTIME-INR
INR: 1.01 (ref <1.50)
Prothrombin Time: 13.3 seconds (ref 11.6–15.2)

## 2014-12-18 NOTE — Progress Notes (Signed)
Quick Note:  See other result note. Patient is aware. Patient needs to keep appt.  He is genotype 3. Likely acute, will need to wait to see if clears virus on his own, no treatment at this time.   Ronita HippsFYI Anna. ______

## 2014-12-18 NOTE — Progress Notes (Signed)
Quick Note:  Please let patient know his labs are somewhat improved. HCV RNA +. Needs repeat LFTs, PT/INR prior to OV on the 17th. ______

## 2014-12-19 NOTE — Progress Notes (Signed)
Quick Note:    Noted    ______

## 2014-12-23 ENCOUNTER — Other Ambulatory Visit: Payer: Self-pay

## 2014-12-23 ENCOUNTER — Other Ambulatory Visit: Payer: Self-pay | Admitting: Gastroenterology

## 2014-12-23 DIAGNOSIS — R7989 Other specified abnormal findings of blood chemistry: Secondary | ICD-10-CM

## 2014-12-23 DIAGNOSIS — R945 Abnormal results of liver function studies: Principal | ICD-10-CM

## 2014-12-27 ENCOUNTER — Telehealth: Payer: Self-pay

## 2014-12-27 NOTE — Telephone Encounter (Signed)
Pt will be here for his appointment on Thursday.

## 2014-12-27 NOTE — Telephone Encounter (Signed)
Have patient's wife contact us after the hearing to let us know if a letter is still needed. He does need to be seen, but it could be postponed a week or so if needed once he is out.

## 2014-12-27 NOTE — Telephone Encounter (Signed)
Pt's wife called today to see if we could [facility] because [facility] husband is [facility]. He has a follow up appointment this Thursday.He is having a bond hearing this afternoon. I told the wife that I would have to find out and let her know. I call Beth Cox with the legal general council for [facility]. Beth stated that is it told up to the doctor if they want to do a letter. Please advise

## 2014-12-28 ENCOUNTER — Telehealth: Payer: Self-pay | Admitting: Gastroenterology

## 2014-12-28 ENCOUNTER — Encounter: Payer: Self-pay | Admitting: Gastroenterology

## 2014-12-28 NOTE — Telephone Encounter (Signed)
Pt called this morning asking if we could fax a letter to his lawyer that he does have an appointment with us in the morning. Please advise

## 2014-12-28 NOTE — Telephone Encounter (Signed)
Letter has been faxed.

## 2014-12-28 NOTE — Telephone Encounter (Signed)
A lady from [employer] office called to give us their fax number [phone] in regards of us sending them a note stating that patient has an OV with us tomorrow. I transferred call to GF since she had spoke to someone yesterday about the situation.

## 2014-12-28 NOTE — Telephone Encounter (Signed)
Completed. Just needs to be sent.

## 2014-12-29 ENCOUNTER — Ambulatory Visit (INDEPENDENT_AMBULATORY_CARE_PROVIDER_SITE_OTHER): Payer: Self-pay | Admitting: Gastroenterology

## 2014-12-29 ENCOUNTER — Other Ambulatory Visit: Payer: Self-pay

## 2014-12-29 ENCOUNTER — Encounter: Payer: Self-pay | Admitting: Gastroenterology

## 2014-12-29 VITALS — BP 109/73 | HR 49 | Temp 97.6°F | Ht 71.0 in | Wt 130.2 lb

## 2014-12-29 DIAGNOSIS — G8929 Other chronic pain: Secondary | ICD-10-CM

## 2014-12-29 DIAGNOSIS — R52 Pain, unspecified: Secondary | ICD-10-CM

## 2014-12-29 DIAGNOSIS — B1710 Acute hepatitis C without hepatic coma: Secondary | ICD-10-CM

## 2014-12-29 DIAGNOSIS — R1314 Dysphagia, pharyngoesophageal phase: Secondary | ICD-10-CM | POA: Insufficient documentation

## 2014-12-29 DIAGNOSIS — R1013 Epigastric pain: Secondary | ICD-10-CM

## 2014-12-29 LAB — HEPATIC FUNCTION PANEL
ALT: 52 U/L (ref 0–53)
AST: 38 U/L — ABNORMAL HIGH (ref 0–37)
Albumin: 4.1 g/dL (ref 3.5–5.2)
Alkaline Phosphatase: 201 U/L — ABNORMAL HIGH (ref 39–117)
Bilirubin, Direct: 1.1 mg/dL — ABNORMAL HIGH (ref 0.0–0.3)
Indirect Bilirubin: 1.8 mg/dL — ABNORMAL HIGH (ref 0.2–1.2)
Total Bilirubin: 2.9 mg/dL — ABNORMAL HIGH (ref 0.2–1.2)
Total Protein: 6.8 g/dL (ref 6.0–8.3)

## 2014-12-29 MED ORDER — SUCRALFATE 1 GM/10ML PO SUSP
1.0000 g | Freq: Four times a day (QID) | ORAL | Status: AC
Start: 2014-12-29 — End: ?

## 2014-12-29 MED ORDER — PANTOPRAZOLE SODIUM 40 MG PO TBEC: 40.0000 mg | DELAYED_RELEASE_TABLET | Freq: Two times a day (BID) | ORAL | Status: AC

## 2014-12-29 NOTE — Assessment & Plan Note (Signed)
Hospitalized in Feb 2016 to monitor trend in LFTs and provide supportive measures. Improved with supportive care. LFTs slowly trending down but remain elevated. Overall, he clinically appears much better than when I initially saw him. Jaundice almost nearly resolved. Will recheck LFTs and INR today. Return in May 2016 and recheck Hep C RNA. Will then recheck in an additional 12 weeks to document if eradicated. Hopefully, he will clear the virus on his own. Genotype 3; [place] if not able to clear on his own.

## 2014-12-29 NOTE — Assessment & Plan Note (Signed)
Chronic upper abdominal pain, recently with exposure to multiple aspirin powders but has stopped ingesting this. Query gastritis, PUD, possible chronic abdominal pain. Will refer to pain management as well. Due to persistent dyspepsia, schedule EGD with Propofol. Notes vague esophageal dysphagia. Dilation as appropriate at time of EGD.   Proceed with upper endoscopy +/- dilation in the near future with Dr. Darrick PennaFields. The risks, benefits, and alternatives have been discussed in detail with patient. They have stated understanding and desire to proceed.  PROPOFOL Protonix increase to BID Carafate

## 2014-12-29 NOTE — Progress Notes (Signed)
Referring Provider: No ref. provider found Primary Care Physician:  Pcp Not In System  Primary GI: Dr. Darrick PennaFields   Chief Complaint  Patient presents with  . Follow-up    HPI:   Anthony Larson is a 39 y.o. male presenting today with a history of Anthony acute Hep C, genotype 3, hospitalized in Feb 2016 at time of diagnosis. Was hospitalized secondary to significantly elevated LFTs and pain. With supportive measures, improved and was discharged. LFTs slowly trending downward. Will need repeat HCV RNA in May 2016 to document for resolving/resolution of virus.   Urine lighter. Notes upper abdominal discomfort. States pain is chronic, intermittently waxing and waning. Some association with eating. No N/V. Appetite comes and goes. Down 7 lbs from initial visit in Feb 2016. No longer taking aspirin powders or NSAIDs. Denies use of Roxicet with friends currently. No drug use. Denies current ETOH use. Taking Protonix once daily. Intermittent solid food dysphagia, mild. Chronic. Notes chronic back pain. Multiple complaints of vague, diffuse pain throughout body. Would like referral to Pain Management.   Past Medical History  Diagnosis Date  . Hepatitis C infection Feb 2016    Past Surgical History  Procedure Laterality Date  . Finger surgery      left hand middle finger    Current Outpatient Prescriptions  Medication Sig Dispense Refill  . pantoprazole (PROTONIX) 40 MG tablet Take 1 tablet (40 mg total) by mouth 2 (two) times daily before a meal. 60 tablet 3  . sucralfate (CARAFATE) 1 GM/10ML suspension Take 10 mLs (1 g total) by mouth 4 (four) times daily. 420 mL 1   No current facility-administered medications for this visit.    Allergies as of 12/29/2014  . (No Known Allergies)    Family History  Problem Relation Age of Onset  . Prostate cancer Father   . Colon cancer Neg Hx   . Throat cancer Mother     History   Social History  . Marital Status: Unknown    Spouse Name: N/A    . Number of Children: N/A  . Years of Education: N/A   Social History Main Topics  . Smoking status: Current Some Day Smoker -- 0.50 packs/day    Types: Cigarettes  . Smokeless tobacco: Not on file  . Alcohol Use: 0.0 oz/week    0 Standard drinks or equivalent per week     Comment: history of ETOH abuse  . Drug Use: Yes     Comment: marijuana, no IV drug use  . Sexual Activity: Not on file   Other Topics Concern  . None   Social History Narrative    Review of Systems: As mentioned in HPI.   Physical Exam: BP 109/73 mmHg  Pulse 49  Temp(Src) 97.6 F (36.4 C)  Ht 5\' 11"  (1.803 m)  Wt 130 lb 3.2 oz (59.058 kg)  BMI 18.17 kg/m2 General:   Alert and oriented. No distress noted. Overall appearance much improved from initial visit. No jaundice Head:  Normocephalic and atraumatic. Eyes:  Mild scleral icterus Mouth:  Oral mucosa pink and moist. Edentulous Heart:  S1, S2 present without murmurs, rubs, or gallops. Regular rate and rhythm. Abdomen:  +BS, soft, mild TTP left-sided abdomen, LUQ/upper abdomen and non-distended. No rebound or guarding. No HSM or masses noted. Msk:  Symmetrical without gross deformities. Normal posture. Extremities:  Without edema. Neurologic:  Alert and  oriented x4;  grossly normal neurologically. Skin:  Intact without significant lesions or rashes. Psych:  Alert and cooperative. Normal mood and affect.  Lab Results  Component Value Date   ALT 850* 12/14/2014   AST 616* 12/14/2014   ALKPHOS 496* 12/14/2014   BILITOT 9.0* 12/14/2014   Lab Results  Component Value Date   INR 1.01 12/14/2014   INR 1.08 12/08/2014   INR 1.10 12/07/2014

## 2014-12-29 NOTE — Assessment & Plan Note (Signed)
Query occult web, ring, stricture, possible uncontrolled GERD. Dilation as appropriate.

## 2014-12-29 NOTE — Telephone Encounter (Signed)
REVIEWED-NO ADDITIONAL RECOMMENDATIONS. 

## 2014-12-29 NOTE — Patient Instructions (Signed)
Please have blood work done today.   We will check further blood work for the hepatitis C in May when you return.   Take Protonix twice a day, 30 minutes before breakfast and dinner. I have also sent in a medication to help coat your stomach called carafate.   We have scheduled you for an upper endoscopy with Dr. Darrick PennaFields and possible dilation if needed.   We have referred you to Pain Management.   We will see you back mid May for an office visit.

## 2014-12-30 LAB — PROTIME-INR
INR: 1.11 (ref <1.50)
PROTHROMBIN TIME: 14.3 s (ref 11.6–15.2)

## 2014-12-30 NOTE — Progress Notes (Signed)
No pcp per patient 

## 2015-01-02 ENCOUNTER — Other Ambulatory Visit: Payer: Self-pay

## 2015-01-04 ENCOUNTER — Telehealth: Payer: Self-pay

## 2015-01-04 ENCOUNTER — Other Ambulatory Visit: Payer: Self-pay

## 2015-01-04 DIAGNOSIS — R109 Unspecified abdominal pain: Secondary | ICD-10-CM

## 2015-01-04 NOTE — Telephone Encounter (Signed)
Spoke with pt about the pain management referral and he states that he is willing to pay out of pocket.

## 2015-01-04 NOTE — Telephone Encounter (Signed)
Noted  

## 2015-01-11 NOTE — Patient Instructions (Signed)
Anthony Larson  01/11/2015   Your procedure is scheduled on:  01/17/2015  Report to [facility] at  815  AM.  Call this number if you have problems the morning of surgery: [phone]   Remember:   Do not eat food or drink liquids after midnight.   Take these medicines the morning of surgery with A SIP OF WATER:  protonix   Do not wear jewelry, make-up or nail polish.  Do not wear lotions, powders, or perfumes.   Do not shave 48 hours prior to surgery. Men may shave face and neck.  Do not bring valuables to the hospital.  [facility] is not responsible for any belongings or valuables.               Contacts, dentures or bridgework may not be worn into surgery.  Leave suitcase in the car. After surgery it may be brought to your room.  For patients admitted to the hospital, discharge time is determined by your treatment team.               Patients discharged the day of surgery will not be allowed to drive home.  Name and phone number of your driver: family  Special Instructions: N/A   Please read over the following fact sheets that you were given: Pain Booklet, Coughing and Deep Breathing, Surgical Site Infection Prevention, Anesthesia Post-op Instructions and Care and Recovery After Surgery Esophagogastroduodenoscopy Esophagogastroduodenoscopy (EGD) is a procedure to examine the lining of the esophagus, stomach, and first part of the small intestine (duodenum). A long, flexible, lighted tube with a camera attached (endoscope) is inserted down the throat to view these organs. This procedure is done to detect problems or abnormalities, such as inflammation, bleeding, ulcers, or growths, in order to treat them. The procedure lasts about 5-20 minutes. It is usually an outpatient procedure, but it may need to be performed in emergency cases in the hospital. LET YOUR CAREGIVER KNOW ABOUT:   Allergies to food or medicine.  All medicines you are taking, including vitamins, herbs,  eyedrops, and over-the-counter medicines and creams.  Use of steroids (by mouth or creams).  Previous problems you or members of your family have had with the use of anesthetics.  Any blood disorders you have.  Previous surgeries you have had.  Other health problems you have.  Possibility of pregnancy, if this applies. RISKS AND COMPLICATIONS  Generally, EGD is a safe procedure. However, as with any procedure, complications can occur. Possible complications include:  Infection.  Bleeding.  Tearing (perforation) of the esophagus, stomach, or duodenum.  Difficulty breathing or not being able to breath.  Excessive sweating.  Spasms of the larynx.  Slowed heartbeat.  Low blood pressure. BEFORE THE PROCEDURE  Do not eat or drink anything for 6-8 hours before the procedure or as directed by your caregiver.  Ask your caregiver about changing or stopping your regular medicines.  If you wear dentures, be prepared to remove them before the procedure.  Arrange for someone to drive you home after the procedure. PROCEDURE   A vein will be accessed to give medicines and fluids. A medicine to relax you (sedative) and a pain reliever will be given through that access into the vein.  A numbing medicine (local anesthetic) may be sprayed on your throat for comfort and to stop you from gagging or coughing.  A mouth guard may be placed in your mouth to protect your teeth and  to keep you from biting on the endoscope.  You will be asked to lie on your left side.  The endoscope is inserted down your throat and into the esophagus, stomach, and duodenum.  Air is put through the endoscope to allow your caregiver to view the lining of your esophagus clearly.  The esophagus, stomach, and duodenum is then examined. During the exam, your caregiver may:  Remove tissue to be examined under a microscope (biopsy) for inflammation, infection, or other medical problems.  Remove  growths.  Remove objects (foreign bodies) that are stuck.  Treat any bleeding with medicines or other devices that stop tissues from bleeding (hot cautery, clipping devices).  Widen (dilate) or stretch narrowed areas of the esophagus and stomach.  The endoscope will then be withdrawn. AFTER THE PROCEDURE  You will be taken to a recovery area to be monitored. You will be able to go home once you are stable and alert.  Do not eat or drink anything until the local anesthetic and numbing medicines have worn off. You may choke.  It is normal to feel bloated, have pain with swallowing, or have a sore throat for a short time. This will wear off.  Your caregiver should be able to discuss his or her findings with you. It will take longer to discuss the test results if any biopsies were taken. Document Released: 01/31/2005 Document Revised: 02/14/2014 Document Reviewed: 09/02/2012 [facility] Patient Information 2015 [employer] [place]. This information is not intended to replace advice given to you by your health care provider. Make sure you discuss any questions you have with your health care provider. Esophageal Dilatation The esophagus is the long, narrow tube which carries food and liquid from the mouth to the stomach. Esophageal dilatation is the technique used to stretch a blocked or narrowed portion of the esophagus. This procedure is used when a part of the esophagus has become so narrow that it becomes difficult, painful or even impossible to swallow. This is generally an uncomplicated form of treatment. When this is not successful, chest surgery may be required. This is a much more extensive form of treatment with a longer recovery time. CAUSES  Some of the more common causes of blockage or strictures of the esophagus are:  Narrowing from longstanding inflammation (soreness and redness) of the lower esophagus. This comes from the constant exposure of the lower esophagus to the acid which bubbles  up from the stomach. Over time this causes scarring and narrowing of the lower esophagus.  Hiatal hernia in which a small part of the stomach bulges (herniates) up through the diaphragm. This can cause a gradual narrowing of the end of the esophagus.  Schatzki ring is a narrow ring of benign (non-cancerous) fibrous tissue which constricts the lower esophagus. The reason for this is not known.  Scleroderma is a connective tissue disorder that affects the esophagus and makes swallowing difficult.  Achalasia is an absence of nerves to the lower esophagus and to the esophageal sphincter. This is the circular muscle between the stomach and esophagus that relaxes to allow food into the stomach. After swallowing, it contracts to keep food in the stomach. This absence of nerves may be congenital (present since birth). This can cause irregular spasms of the lower esophageal muscle. This spasm does not open up to allow food and fluid through. The result is a persistent blockage with subsequent slow trickling of the esophageal contents into the stomach.  Strictures may develop from swallowing materials which damage the  esophagus. Some examples are strong acids or alkalis such as lye.  Growths such as benign (non-cancerous) and malignant (cancerous) tumors can block the esophagus.  Hereditary (present since birth) causes. DIAGNOSIS  Your caregiver often suspects this problem by taking a medical history. They will also do a physical exam. They can then prove their suspicions using X-rays and endoscopy. Endoscopy is an exam in which a tube like a small, flexible telescope is used to look at your esophagus.  TREATMENT There are different stretching (dilating) techniques that can be used. Simple bougie dilatation may be done in the office. This usually takes only a couple minutes. A numbing (anesthetic) spray of the throat is used. Endoscopy, when done, is done in an endoscopy suite under mild sedation. When  fluoroscopy is used, the procedure is performed in X-ray. Other techniques require a little longer time. Recovery is usually quick. There is no waiting time to begin eating and drinking to test success of the treatment. Following are some of the methods used. Narrowing of the esophagus is treated by making it bigger. Commonly this is a mechanical problem which can be treated with stretching. This can be done in different ways. Your caregiver will discuss these with you. Some of the means used are:  A series of graduated (increasing thickness) flexible dilators can be used. These are weighted tubes passed through the esophagus into the stomach. The tubes used become progressively larger until the desired stretched size is reached. Graduated dilators are a simple and quick way of opening the esophagus. No visualization is required.  Another method is the use of endoscopy to place a flexible wire across the stricture. The endoscope is removed and the wire left in place. A dilator with a hole through it from end to end is guided down the esophagus and across the stricture. One or more of these dilators are passed over the wire. At the end of the exam, the wire is removed. This type of treatment may be performed in the X-ray department under fluoroscopy. An advantage of this procedure is the examiner is visualizing the end opening in the esophagus.  Stretching of the esophagus may be done using balloons. Deflated balloons are placed through the endoscope and across the stricture. This type of balloon dilatation is often done at the time of endoscopy or fluoroscopy. Flexible endoscopy allows the examiner to directly view the stricture. A balloon is inserted in the deflated form into the area of narrowing. It is then inflated with air to a certain pressure that is preset for a given circumference. When inflated, it becomes sausage shaped, stretched, and makes the stricture larger.  Achalasia requires a longer,  larger balloon-type dilator. This is frequently done under X-ray control. In this situation, the spastic muscle fibers in the lower esophagus are stretched. All of the above procedures make the passage of food and water into the stomach easier. They also make it easier for stomach contents to reflux back into the esophagus. Special medications may be used following the procedure to help prevent further stricturing. Proton-pump inhibitor medications are good at decreasing the amount of acid in the stomach juice. When stomach juice refluxes into the esophagus, the juice is no longer as acidic and is less likely to burn or scar the esophagus. RISKS AND COMPLICATIONS Esophageal dilatation is usually performed effectively and without problems. Some complications that can occur are:  A small amount of bleeding almost always happens where the stretching takes place. If this is too  excessive it may require more aggressive treatment.  An uncommon complication is perforation (making a hole) of the esophagus. The esophagus is thin. It is easy to make a hole in it. If this happens, an operation may be necessary to repair this.  A small, undetected perforation could lead to an infection in the chest. This can be very serious. HOME CARE INSTRUCTIONS   If you received sedation for your procedure, do not drive, make important decisions, or perform any activities requiring your full coordination. Do not drink alcohol, take sedatives, or use any mind altering chemicals unless instructed by your caregiver.  You may use throat lozenges or warm salt water gargles if you have throat discomfort.  You can begin eating and drinking normally on return home unless instructed otherwise. Do not purposely try to force large chunks of food down to test the benefits of your procedure.  Mild discomfort can be eased with sips of ice water.  Medications for discomfort may or may not be needed. SEEK IMMEDIATE MEDICAL CARE IF:    You begin vomiting up blood.  You develop black, tarry stools.  You develop chills or an unexplained temperature of over 101F (38.3C)  You develop chest or abdominal pain.  You develop shortness of breath, or feel light-headed or faint.  Your swallowing is becoming more painful, difficult, or you are unable to swallow. MAKE SURE YOU:   Understand these instructions.  Will watch your condition.  Will get help right away if you are not doing well or get worse. Document Released: 11/21/2005 Document Revised: 02/14/2014 Document Reviewed: 01/08/2006 [facility] Patient Information 2015 [place], [place]. This information is not intended to replace advice given to you by your health care provider. Make sure you discuss any questions you have with your health care provider. PATIENT INSTRUCTIONS POST-ANESTHESIA  IMMEDIATELY FOLLOWING SURGERY:  Do not drive or operate machinery for the first twenty four hours after surgery.  Do not make any important decisions for twenty four hours after surgery or while taking narcotic pain medications or sedatives.  If you develop intractable nausea and vomiting or a severe headache please notify your doctor immediately.  FOLLOW-UP:  Please make an appointment with your surgeon as instructed. You do not need to follow up with anesthesia unless specifically instructed to do so.  WOUND CARE INSTRUCTIONS (if applicable):  Keep a dry clean dressing on the anesthesia/puncture wound site if there is drainage.  Once the wound has quit draining you may leave it open to air.  Generally you should leave the bandage intact for twenty four hours unless there is drainage.  If the epidural site drains for more than 36-48 hours please call the anesthesia department.  QUESTIONS?:  Please feel free to call your physician or the hospital operator if you have any questions, and they will be happy to assist you.

## 2015-01-11 NOTE — Progress Notes (Signed)
Quick Note:  LFTs drastically improved! Good to see. Keep upcoming appt. ______

## 2015-01-12 NOTE — Progress Notes (Signed)
Quick Note:  Pt is aware of results. He asked about the referral to Pain Management and I told him that it was made. He should be hearing from the Pain Management office and I will send this message to the person who made the appt if there is anything else to know. She was on the phone with another pt when he was on the phone. ______

## 2015-01-13 ENCOUNTER — Encounter (HOSPITAL_COMMUNITY): Payer: Self-pay

## 2015-01-13 ENCOUNTER — Encounter (HOSPITAL_COMMUNITY)
Admission: RE | Admit: 2015-01-13 | Discharge: 2015-01-13 | Disposition: A | Discharge status: Home / Self Care | Payer: Self-pay | Source: Ambulatory Visit | Attending: Gastroenterology | Admitting: Gastroenterology

## 2015-01-13 DIAGNOSIS — R1310 Dysphagia, unspecified: Secondary | ICD-10-CM | POA: Insufficient documentation

## 2015-01-13 DIAGNOSIS — Z01812 Encounter for preprocedural laboratory examination: Secondary | ICD-10-CM | POA: Insufficient documentation

## 2015-01-13 HISTORY — DX: Gastro-esophageal reflux disease without esophagitis: K21.9

## 2015-01-13 LAB — BASIC METABOLIC PANEL
Anion gap: 5 (ref 5–15)
BUN: 8 mg/dL (ref 6–23)
CALCIUM: 8.8 mg/dL (ref 8.4–10.5)
CO2: 29 mmol/L (ref 19–32)
Chloride: 104 mmol/L (ref 96–112)
Creatinine, Ser: 0.71 mg/dL (ref 0.50–1.35)
GFR calc Af Amer: >90 mL/min (ref >90)
GFR calc non Af Amer: >90 mL/min (ref >90)
GLUCOSE: 96 mg/dL (ref 70–99)
Potassium: 4.2 mmol/L (ref 3.5–5.1)
SODIUM: 138 mmol/L (ref 135–145)

## 2015-01-13 LAB — CBC
HCT: 41.1 % (ref 39.0–52.0)
HEMOGLOBIN: 14.2 g/dL (ref 13.0–17.0)
MCH: 33.9 pg (ref 26.0–34.0)
MCHC: 34.5 g/dL (ref 30.0–36.0)
MCV: 98.1 fL (ref 78.0–100.0)
Platelets: 251 10*3/uL (ref 150–400)
RBC: 4.19 MIL/uL — ABNORMAL LOW (ref 4.22–5.81)
RDW: 14 % (ref 11.5–15.5)
WBC: 9.9 10*3/uL (ref 4.0–10.5)

## 2015-01-17 ENCOUNTER — Ambulatory Visit (HOSPITAL_COMMUNITY): Payer: Self-pay | Admitting: Anesthesiology

## 2015-01-17 ENCOUNTER — Ambulatory Visit (HOSPITAL_COMMUNITY)
Admission: RE | Admit: 2015-01-17 | Discharge: 2015-01-17 | Disposition: A | Discharge status: Home / Self Care | Payer: Self-pay | Source: Ambulatory Visit | Attending: Gastroenterology | Admitting: Gastroenterology

## 2015-01-17 ENCOUNTER — Encounter (HOSPITAL_COMMUNITY): Payer: Self-pay | Admitting: *Deleted

## 2015-01-17 ENCOUNTER — Encounter (HOSPITAL_COMMUNITY)
Admission: RE | Disposition: A | Discharge status: Home / Self Care | Payer: Self-pay | Source: Ambulatory Visit | Attending: Gastroenterology

## 2015-01-17 DIAGNOSIS — Z79899 Other long term (current) drug therapy: Secondary | ICD-10-CM | POA: Insufficient documentation

## 2015-01-17 DIAGNOSIS — R1310 Dysphagia, unspecified: Secondary | ICD-10-CM

## 2015-01-17 DIAGNOSIS — B1920 Unspecified viral hepatitis C without hepatic coma: Secondary | ICD-10-CM | POA: Insufficient documentation

## 2015-01-17 DIAGNOSIS — F17210 Nicotine dependence, cigarettes, uncomplicated: Secondary | ICD-10-CM | POA: Insufficient documentation

## 2015-01-17 DIAGNOSIS — K2970 Gastritis, unspecified, without bleeding: Secondary | ICD-10-CM

## 2015-01-17 DIAGNOSIS — K219 Gastro-esophageal reflux disease without esophagitis: Secondary | ICD-10-CM | POA: Insufficient documentation

## 2015-01-17 DIAGNOSIS — K2950 Unspecified chronic gastritis without bleeding: Secondary | ICD-10-CM | POA: Insufficient documentation

## 2015-01-17 HISTORY — PX: ESOPHAGOGASTRODUODENOSCOPY (EGD) WITH PROPOFOL: SHX5813

## 2015-01-17 HISTORY — PX: ESOPHAGEAL DILATION: SHX303

## 2015-01-17 HISTORY — PX: BIOPSY: SHX5522

## 2015-01-17 SURGERY — ESOPHAGOGASTRODUODENOSCOPY (EGD) WITH PROPOFOL
Anesthesia: Monitor Anesthesia Care

## 2015-01-17 MED ORDER — ONDANSETRON HCL 4 MG/2ML IJ SOLN: 4.0000 mg | Freq: Once | INTRAMUSCULAR | Status: DC | PRN

## 2015-01-17 MED ORDER — FENTANYL CITRATE 0.05 MG/ML IJ SOLN
25.0000 ug | INTRAMUSCULAR | Status: AC
  Administered 2015-01-17 (×2): 25 ug via INTRAVENOUS

## 2015-01-17 MED ORDER — FENTANYL CITRATE 0.05 MG/ML IJ SOLN: 25.0000 ug | INTRAMUSCULAR | Status: DC | PRN

## 2015-01-17 MED ORDER — MIDAZOLAM HCL 2 MG/2ML IJ SOLN
INTRAMUSCULAR | Status: AC
  Filled 2015-01-17: qty 2

## 2015-01-17 MED ORDER — LIDOCAINE HCL (CARDIAC) 10 MG/ML IV SOLN
INTRAVENOUS | Status: DC | PRN
  Administered 2015-01-17: 50 mg via INTRAVENOUS

## 2015-01-17 MED ORDER — FENTANYL CITRATE 0.05 MG/ML IJ SOLN
INTRAMUSCULAR | Status: AC
  Filled 2015-01-17: qty 2

## 2015-01-17 MED ORDER — MINERAL OIL LIGHT 100 % EX OIL
TOPICAL_OIL | CUTANEOUS | Status: DC | PRN
  Administered 2015-01-17: 1 via TOPICAL

## 2015-01-17 MED ORDER — PROPOFOL 10 MG/ML IV BOLUS
INTRAVENOUS | Status: AC
  Filled 2015-01-17: qty 20

## 2015-01-17 MED ORDER — STERILE WATER FOR IRRIGATION IR SOLN
Status: DC | PRN
  Administered 2015-01-17: 1000 mL

## 2015-01-17 MED ORDER — MIDAZOLAM HCL 2 MG/2ML IJ SOLN
1.0000 mg | INTRAMUSCULAR | Status: AC | PRN
  Administered 2015-01-17 (×3): 2 mg via INTRAVENOUS
  Filled 2015-01-17 (×2): qty 2

## 2015-01-17 MED ORDER — LIDOCAINE VISCOUS 2 % MT SOLN
OROMUCOSAL | Status: AC
  Filled 2015-01-17: qty 15

## 2015-01-17 MED ORDER — FENTANYL CITRATE 0.05 MG/ML IJ SOLN
INTRAMUSCULAR | Status: DC | PRN
Start: 2015-01-17 — End: 2015-01-17
  Administered 2015-01-17 (×3): 25 ug via INTRAVENOUS

## 2015-01-17 MED ORDER — MIDAZOLAM HCL 5 MG/5ML IJ SOLN
INTRAMUSCULAR | Status: DC | PRN
  Administered 2015-01-17: 2 mg via INTRAVENOUS

## 2015-01-17 MED ORDER — PROPOFOL INFUSION 10 MG/ML OPTIME
INTRAVENOUS | Status: DC | PRN
  Administered 2015-01-17: 150 ug/kg/min via INTRAVENOUS

## 2015-01-17 MED ORDER — LACTATED RINGERS IV SOLN
INTRAVENOUS | Status: DC
  Administered 2015-01-17: 1000 mL via INTRAVENOUS

## 2015-01-17 MED ORDER — ONDANSETRON HCL 4 MG/2ML IJ SOLN
4.0000 mg | Freq: Once | INTRAMUSCULAR | Status: AC
  Administered 2015-01-17: 4 mg via INTRAVENOUS

## 2015-01-17 MED ORDER — LIDOCAINE HCL (PF) 1 % IJ SOLN
INTRAMUSCULAR | Status: AC
  Filled 2015-01-17: qty 10

## 2015-01-17 MED ORDER — LIDOCAINE VISCOUS 2 % MT SOLN
4.0000 mL | Freq: Once | OROMUCOSAL | Status: AC
Start: 2015-01-17 — End: 2015-01-17
  Administered 2015-01-17: 4 mL via OROMUCOSAL

## 2015-01-17 MED ORDER — MINERAL OIL PO OIL
TOPICAL_OIL | ORAL | Status: AC
  Filled 2015-01-17: qty 30

## 2015-01-17 MED ORDER — ONDANSETRON HCL 4 MG/2ML IJ SOLN
INTRAMUSCULAR | Status: AC
  Filled 2015-01-17: qty 2

## 2015-01-17 MED ORDER — LACTATED RINGERS IV SOLN
INTRAVENOUS | Status: DC | PRN
  Administered 2015-01-17: 09:00:00 via INTRAVENOUS

## 2015-01-17 MED ORDER — LIDOCAINE HCL (PF) 1 % IJ SOLN
INTRAMUSCULAR | Status: AC
  Filled 2015-01-17: qty 5

## 2015-01-17 SURGICAL SUPPLY — 11 items
BLOCK BITE 60FR ADLT L/F BLUE (MISCELLANEOUS) ×3 IMPLANT
FLOOR PAD 36X40 (MISCELLANEOUS) ×3
FORCEPS BIOP RAD 4 LRG CAP 4 (CUTTING FORCEPS) ×3 IMPLANT
FORMALIN 10 PREFIL 20ML (MISCELLANEOUS) ×3 IMPLANT
KIT CLEAN ENDO COMPLIANCE (KITS) ×3 IMPLANT
MANIFOLD NEPTUNE II (INSTRUMENTS) ×3 IMPLANT
OVERTUBE ENDOCUFF GREEN (MISCELLANEOUS) ×3 IMPLANT
PAD FLOOR 36X40 (MISCELLANEOUS) ×2 IMPLANT
SYRINGE 60CC LL (MISCELLANEOUS) ×3 IMPLANT
TUBING IRRIGATION ENDOGATOR (MISCELLANEOUS) ×3 IMPLANT
WATER STERILE IRR 1000ML POUR (IV SOLUTION) ×3 IMPLANT

## 2015-01-17 NOTE — Transfer of Care (Signed)
Immediate Anesthesia Transfer of Care Note  Patient: Anthony Larson  Procedure(s) Performed: Procedure(s): ESOPHAGOGASTRODUODENOSCOPY (EGD) WITH PROPOFOL (N/A) ESOPHAGEAL DILATION WIRE GUIDED WITH SAVARY DILATORS (12MM, 12.8MM, 14MM, 15MM, 16MM) (N/A) BIOPSY  Patient Location: PACU  Anesthesia Type:MAC  Level of Consciousness: sedated and patient cooperative  Airway & Oxygen Therapy: Patient Spontanous Breathing and Patient connected to nasal cannula oxygen  Post-op Assessment: Report given to RN and Post -op Vital signs reviewed and stable  Post vital signs: Reviewed and stable  Last Vitals:  Filed Vitals:   01/17/15 1021  BP:   Temp: 36.4 C  Resp:     Complications: No apparent anesthesia complications

## 2015-01-17 NOTE — Anesthesia Preprocedure Evaluation (Addendum)
Anesthesia Evaluation  Patient identified by MRN, date of birth, ID band Patient awake    Reviewed: Allergy & Precautions, NPO status , Patient's Chart, lab work & pertinent test results  Airway Mallampati: I  TM Distance: >3 FB     Dental  (+) Edentulous Upper, Edentulous Lower   Pulmonary Current Smoker (am cough),          Cardiovascular negative cardio ROS  Rhythm:Regular Rate:Normal     Neuro/Psych    GI/Hepatic GERD-  Medicated,(+) Hepatitis -, C  Endo/Other    Renal/GU      Musculoskeletal   Abdominal   Peds  Hematology   Anesthesia Other Findings   Reproductive/Obstetrics                            Anesthesia Physical Anesthesia Plan  ASA: III  Anesthesia Plan: MAC   Post-op Pain Management:    Induction: Intravenous  Airway Management Planned: Simple Face Mask  Additional Equipment:   Intra-op Plan:   Post-operative Plan:   Informed Consent: I have reviewed the patients History and Physical, chart, labs and discussed the procedure including the risks, benefits and alternatives for the proposed anesthesia with the patient or authorized representative who has indicated his/her understanding and acceptance.     Plan Discussed with:   Anesthesia Plan Comments:         Anesthesia Quick Evaluation

## 2015-01-17 NOTE — H&P (Signed)
  Primary Care Physician:  Pcp Not In System Primary Gastroenterologist:  Dr. Darrick PennaFields  Pre-Procedure History & Physical: HPI:  Anthony Larson is a 39 y.o. male here for DYSPHAGIA.  Past Medical History  Diagnosis Date  . Hepatitis C infection Feb 2016  . GERD (gastroesophageal reflux disease)     Past Surgical History  Procedure Laterality Date  . Finger surgery      left hand middle finger    Prior to Admission medications   Medication Sig Start Date End Date Taking? Authorizing Provider  pantoprazole (PROTONIX) 40 MG tablet Take 1 tablet (40 mg total) by mouth 2 (two) times daily before a meal. 12/29/14  Yes Nira RetortAnna W Sams, NP  sucralfate (CARAFATE) 1 GM/10ML suspension Take 10 mLs (1 g total) by mouth 4 (four) times daily. 12/29/14   Nira RetortAnna W Sams, NP    Allergies as of 12/29/2014  . (No Known Allergies)    Family History  Problem Relation Age of Onset  . Prostate cancer Father   . Colon cancer Neg Hx   . Throat cancer Mother     History   Social History  . Marital Status: Married    Spouse Name: N/A  . Number of Children: N/A  . Years of Education: N/A   Occupational History  . Not on file.   Social History Main Topics  . Smoking status: Current Some Day Smoker -- 0.50 packs/day for 25 years    Types: Cigarettes  . Smokeless tobacco: Not on file  . Alcohol Use: 0.0 oz/week    0 Standard drinks or equivalent per week     Comment: history of ETOH abuse  . Drug Use: Yes    Special: Marijuana     Comment: marijuana, no IV drug use  . Sexual Activity: Not on file   Other Topics Concern  . Not on file   Social History Narrative    Review of Systems: See HPI, otherwise negative ROS   Physical Exam: BP 109/71 mmHg  Temp(Src) 97.6 F (36.4 C) (Oral)  Resp 20  Ht 5\' 11"  (1.803 m)  Wt 137 lb (62.143 kg)  BMI 19.12 kg/m2  SpO2 100% General:   Alert,  pleasant and cooperative in NAD Head:  Normocephalic and atraumatic. Neck:  Supple; Lungs:  Clear  throughout to auscultation.    Heart:  Regular rate and rhythm. Abdomen:  Soft, nontender and nondistended. Normal bowel sounds, without guarding, and without rebound.   Neurologic:  Alert and  oriented x4;  grossly normal neurologically.  Impression/Plan:     DYSPHAGIA  PLAN:  EGD/DIL TODAY

## 2015-01-17 NOTE — Transfer of Care (Deleted)
Immediate Anesthesia Transfer of Care Note  Patient: Anthony Larson  Procedure(s) Performed: Procedure(s): ESOPHAGOGASTRODUODENOSCOPY (EGD) WITH PROPOFOL (N/A) ESOPHAGEAL DILATION WIRE GUIDED WITH SAVARY DILATORS (12MM, 12.8MM, 14MM, 15MM, 16MM) (N/A) BIOPSY  Patient Location: PACU  Anesthesia Type:MAC  Level of Consciousness: awake, alert , oriented and patient cooperative  Airway & Oxygen Therapy: Patient Spontanous Breathing and Patient connected to face mask oxygen  Post-op Assessment: Report given to RN and Post -op Vital signs reviewed and stable  Post vital signs: Reviewed and stable  Last Vitals:  Filed Vitals:   01/17/15 0939  BP: 122/63  Temp:   Resp: 14    Complications: No apparent anesthesia complications

## 2015-01-17 NOTE — Op Note (Signed)
[facility] [address] [place], [id number]   ENDOSCOPY PROCEDURE REPORT  PATIENT: Anthony Larson, Anthony Larson  MR#: [id number] BIRTHDATE: [birth year] , 38  yrs. old GENDER: male  ENDOSCOPIST: West BaliSandi Larson Layten Aiken, MD REFFERED BY:  PROCEDURE DATE:  01/17/2015 PROCEDURE:   EGD with biopsy and EGD with dilatation over guidewire   INDICATIONS:1.  dysphagia. MEDICATIONS: Monitored anesthesia care TOPICAL ANESTHETIC: Viscous Xylocaine  DESCRIPTION OF PROCEDURE:   After the risks benefits and alternatives of the procedure were thoroughly explained, informed consent was obtained.  The     endoscope was introduced through the mouth and advanced to the second portion of the duodenum. The instrument was slowly withdrawn as the mucosa was carefully examined.  Prior to withdrawal of the scope, the guidwire was placed.  The esophagus was dilated successfully.  The patient was recovered in endoscopy and discharged home in satisfactory condition.   ESOPHAGUS: POSSIBLE PROXIMAL CERVICAL WEB.   STOMACH: Mild non-erosive gastritis (inflammation) was found in the gastric antrum.  Multiple biopsies were performed using cold forceps. DUODENUM: The duodenal mucosa showed no abnormalities in the bulb and second portion of the duodenum.   Dilation was then performed at the proximal esophagus  Dilator: Savary over guidewire Size(s): 12-16 MM Resistance: minimal Heme: none  COMPLICATIONS: There were no immediate complications.  ENDOSCOPIC IMPRESSION: 1.   POSSIBLE PROXIMAL CERVICAL WEB 2.   MILD Non-erosive gastritis  RECOMMENDATIONS: CONTINUE PROTONIX.  TAKE 30 MINUTES PRIOR TO MEALS TWICE DAILY. FOLLOW A LOW FAT DIET. AWAIT BIOPSY. FOLLOW UP IN 3 MOS.    eSigned:  West BaliSandi Larson Averill Pons, MD 01/17/2015 9:50 PM   CPT CODES: ICD CODES:  The ICD and CPT codes recommended by this software are interpretations from the data that the clinical staff has captured with the software.  The verification  of the translation of this report to the ICD and CPT codes and modifiers is the sole responsibility of the health care institution and practicing physician where this report was generated.  [employer] will not be held responsible for the validity of the ICD and CPT codes included on this report.  AMA assumes no liability for data contained or not contained herein. CPT is a Publishing rights managerregistered trademark of the [employer].

## 2015-01-17 NOTE — Anesthesia Procedure Notes (Signed)
Procedure Name: MAC Date/Time: 01/17/2015 9:42 AM Performed by: Pernell DupreADAMS, AMY A Pre-anesthesia Checklist: Patient identified, Timeout performed, Emergency Drugs available, Suction available and Patient being monitored Oxygen Delivery Method: Simple face mask

## 2015-01-17 NOTE — Progress Notes (Signed)
REVIEWED-NO ADDITIONAL RECOMMENDATIONS. 

## 2015-01-17 NOTE — Anesthesia Postprocedure Evaluation (Signed)
  Anesthesia Post-op Note  Patient: Anthony Larson  Procedure(s) Performed: Procedure(s): ESOPHAGOGASTRODUODENOSCOPY (EGD) WITH PROPOFOL (N/A) ESOPHAGEAL DILATION WIRE GUIDED WITH SAVARY DILATORS (12MM, 12.8MM, 14MM, 15MM, 16MM) (N/A) BIOPSY  Patient Location: PACU  Anesthesia Type:MAC  Level of Consciousness: sedated and patient cooperative  Airway and Oxygen Therapy: Patient Spontanous Breathing and Patient connected to nasal cannula oxygen  Post-op Pain: none  Post-op Assessment: Post-op Vital signs reviewed and Patient's Cardiovascular Status Stable  Post-op Vital Signs: Reviewed and stable  Last Vitals:  Filed Vitals:   01/17/15 1021  BP:   Temp: 36.4 C  Resp:     Complications: No apparent anesthesia complications

## 2015-01-17 NOTE — Discharge Instructions (Signed)
I dilated your esophagus. 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 You have gastritis. I biopsied your stomach.   CONTINUE PROTONIX. TAKE 30 MINUTES PRIOR TO MEALS TWICE DAILY.  FOLLOW A LOW FAT DIET. SEE INFO BELOW.  YOUR BIOPSY RESULTS WILL BE AVAILABLE IN MY CHART AFTER APR 7 AND MY OFFICE WILL CONTACT YOU IN 10-14 DAYS WITH YOUR RESULTS.   FOLLOW UP IN 3 MOS.   UPPER ENDOSCOPY AFTER CARE Read the instructions outlined below and refer to this sheet in the next week. These discharge instructions provide you with general information on caring for yourself after you leave the hospital. While your treatment has been planned according to the most current medical practices available, unavoidable complications occasionally occur. If you have any problems or questions after discharge, call DR. Hawley Michel, [phone].  ACTIVITY  You may resume your regular activity, but move at a slower pace for the next 24 hours.   Take frequent rest periods for the next 24 hours.   Walking will help get rid of the air and reduce the bloated feeling in your belly (abdomen).   No driving for 24 hours (because of the medicine (anesthesia) used during the test).   You may shower.   Do not sign any important legal documents or operate any machinery for 24 hours (because of the anesthesia used during the test).    NUTRITION  Drink plenty of fluids.   You may resume your normal diet as instructed by your doctor.   Begin with a light meal and progress to your normal diet. Heavy or fried foods are harder to digest and may make you feel sick to your stomach (nauseated).   Avoid alcoholic beverages for 24 hours or as instructed.    MEDICATIONS  You may resume your normal medications.   WHAT YOU CAN EXPECT TODAY  Some feelings of bloating in the abdomen.   Passage of more gas than usual.    IF YOU HAD A BIOPSY TAKEN DURING THE UPPER ENDOSCOPY:  Eat a soft diet IF YOU HAVE NAUSEA,  BLOATING, ABDOMINAL PAIN, OR VOMITING.    FINDING OUT THE RESULTS OF YOUR TEST Not all test results are available during your visit. DR. Darrick Penna WILL CALL YOU WITHIN 14 DAYS OF YOUR PROCEDUE WITH YOUR RESULTS. Do not assume everything is normal if you have not heard from DR. Brit Wernette, CALL HER OFFICE AT [phone].  SEEK IMMEDIATE MEDICAL ATTENTION AND CALL THE OFFICE: [phone] IF:  You have more than a spotting of blood in your stool.   Your belly is swollen (abdominal distention).   You are nauseated or vomiting.   You have a temperature over 101F.   You have abdominal pain or discomfort that is severe or gets worse throughout the day.    Low-Fat Diet BREADS, CEREALS, PASTA, RICE, DRIED PEAS, AND BEANS These products are high in carbohydrates and most are low in fat. Therefore, they can be increased in the diet as substitutes for fatty foods. They too, however, contain calories and should not be eaten in excess. Cereals can be eaten for snacks as well as for breakfast.  Include foods that contain fiber (fruits, vegetables, whole grains, and legumes). Research shows that fiber may lower blood cholesterol levels, especially the water-soluble fiber found in fruits, vegetables, oat products, and legumes. FRUITS AND VEGETABLES It is good to eat fruits and vegetables. Besides being sources of fiber, both are rich in vitamins and some minerals. They help you get  the daily allowances of these nutrients. Fruits and vegetables can be used for snacks and desserts. MEATS Limit lean meat, chicken, [place], and fish to no more than 6 ounces per day. Beef, Pork, and Lamb Use lean cuts of beef, pork, and lamb. Lean cuts include:  Extra-lean ground beef.  Arm roast.  Sirloin tip.  Center-cut ham.  Round steak.  Loin chops.  Rump roast.  Tenderloin.  Trim all fat off the outside of meats before cooking. It is not necessary to severely decrease the intake of red meat, but lean choices should  be made. Lean meat is rich in protein and contains a highly absorbable form of iron. Premenopausal women, in particular, should avoid reducing lean red meat because this could increase the risk for low red blood cells (iron-deficiency anemia).  Chicken and [place] These are good sources of protein. The fat of poultry can be reduced by removing the skin and underlying fat layers before cooking. Chicken and [place] can be substituted for lean red meat in the diet. Poultry should not be fried or covered with high-fat sauces. Fish and Shellfish Fish is a good source of protein. Shellfish contain cholesterol, but they usually are low in saturated fatty acids. The preparation of fish is important. Like chicken and [place], they should not be fried or covered with high-fat sauces. EGGS Egg whites contain no fat or cholesterol. They can be eaten often. Try 1 to 2 egg whites instead of whole eggs in recipes or use egg substitutes that do not contain yolk.  MILK AND DAIRY PRODUCTS Use skim or 1% milk instead of 2% or whole milk. Decrease whole milk, natural, and processed cheeses. Use nonfat or low-fat (2%) cottage cheese or low-fat cheeses made from vegetable oils. Choose nonfat or low-fat (1 to 2%) yogurt. Experiment with evaporated skim milk in recipes that call for heavy cream. Substitute low-fat yogurt or low-fat cottage cheese for sour cream in dips and salad dressings. Have at least 2 servings of low-fat dairy products, such as 2 glasses of skim (or 1%) milk each day to help get your daily calcium intake.  FATS AND OILS Butterfat, lard, and beef fats are high in saturated fat and cholesterol. These should be avoided.Vegetable fats do not contain cholesterol. AVOID coconut oil, palm oil, and palm kernel oil, WHICH are very high in saturated fats. These should be limited. These fats are often used in bakery goods, processed foods, popcorn, oils, and nondairy creamers. Vegetable shortenings and some peanut  butters contain hydrogenated oils, which are also saturated fats. Read the labels on these foods and check for saturated vegetable oils.  Desirable liquid vegetable oils are corn oil, cottonseed oil, olive oil, canola oil, safflower oil, soybean oil, and sunflower oil. Peanut oil is not as good, but small amounts are acceptable. Buy a heart-healthy tub margarine that has no partially hydrogenated oils in the ingredients. AVOID Mayonnaise and salad dressings often are made from unsaturated fats.  OTHER EATING TIPS Snacks  Most sweets should be limited as snacks. They tend to be rich in calories and fats, and their caloric content outweighs their nutritional value. Some good choices in snacks are graham crackers, melba toast, soda crackers, bagels (no egg), English muffins, fruits, and vegetables. These snacks are preferable to snack crackers, [place] fries, and chips. Popcorn should be air-popped or cooked in small amounts of liquid vegetable oil.  Desserts Eat fruit, low-fat yogurt, and fruit ices instead of pastries, cake, and cookies. Sherbet, angel food cake,  gelatin dessert, frozen low-fat yogurt, or other frozen products that do not contain saturated fat (pure fruit juice bars, frozen ice pops) are also acceptable.   COOKING METHODS Choose those methods that use little or no fat. They include: Poaching.  Braising.  Steaming.  Grilling.  Baking.  Stir-frying.  Broiling.  Microwaving.  Foods can be cooked in a nonstick pan without added fat, or use a nonfat cooking spray in regular cookware. Limit fried foods and avoid frying in saturated fat. Add moisture to lean meats by using water, broth, cooking wines, and other nonfat or low-fat sauces along with the cooking methods mentioned above. Soups and stews should be chilled after cooking. The fat that forms on top after a few hours in the refrigerator should be skimmed off. When preparing meals, avoid using excess salt. Salt can contribute to  raising blood pressure in some people.  EATING AWAY FROM HOME Order entres, potatoes, and vegetables without sauces or butter. When meat exceeds the size of a deck of cards (3 to 4 ounces), the rest can be taken home for another meal. Choose vegetable or fruit salads and ask for low-calorie salad dressings to be served on the side. Use dressings sparingly. Limit high-fat toppings, such as bacon, crumbled eggs, cheese, sunflower seeds, and olives. Ask for heart-healthy tub margarine instead of butter.   Gastritis  Gastritis is an inflammation (the body's way of reacting to injury and/or infection) of the stomach. It is often caused by viral or bacterial (germ) infections. It can also be caused BY ASPIRIN, BC/GOODY POWDER'S, (IBUPROFEN) MOTRIN, OR ALEVE (NAPROXEN), chemicals (including alcohol), SPICY FOODS, and medications. This illness may be associated with generalized malaise (feeling tired, not well), UPPER ABDOMINAL STOMACH cramps, and fever. One common bacterial cause of gastritis is an organism known as H. Pylori. This can be treated with antibiotics.   REFLUX   SYMPTOMS Common symptoms of GERD are heartburn (burning in your chest). This is worse when lying down or bending over. It may also cause belching and indigestion. Some of the things which make GERD worse are:  Increased weight pushes on stomach making acid rise more easily.   Smoking markedly increases acid production.   HOME CARE INSTRUCTIONS  Try to achieve and maintain an ideal body weight.   Avoid drinking alcoholic beverages.   DO NOT smokE.   Do not wear tight clothing around your chest or stomach.   Eat smaller meals and eat more frequently. This keeps your stomach from getting too full. Eat slowly.   Do not lie down for 2 or 3 hours after eating. Do not eat or drink anything 1 to 2 hours before going to bed.   Avoid caffeine beverages (colas, coffee, cocoa, tea), fatty foods, citrus fruits and all other foods  and drinks that contain acid and that seem to increase the problems.   Avoid bending over, especially after eating OR STRAINING. Anything that increases the pressure in your belly increases the amount of acid that may be pushed up into your esophagus.    ESOPHAGEAL STRICTURE  Esophageal strictures can be caused by stomach acid backing up into the tube that carries food from the mouth down to the stomach (lower esophagus).  TREATMENT There are a number of medicines used to treat reflux/stricture, including: Antacids.  Proton-pump inhibitors: PROTONIX   HOME CARE INSTRUCTIONS Eat 2-3 hours before going to bed.  Try to reach and maintain a healthy weight.  Do not eat just a few very  large meals. Instead, eat 4 TO 6 smaller meals throughout the day.  Try to identify foods and beverages that make your symptoms worse, and avoid these.  Avoid tight clothing.  Do not exercise right after eating.

## 2015-01-19 ENCOUNTER — Encounter (HOSPITAL_COMMUNITY): Payer: Self-pay | Admitting: Gastroenterology

## 2015-01-24 ENCOUNTER — Telehealth: Payer: Self-pay | Admitting: Gastroenterology

## 2015-01-24 NOTE — Telephone Encounter (Signed)
LMOM to call.

## 2015-01-24 NOTE — Telephone Encounter (Signed)
Patient called checking if his EGD results were back. He had procedure done on 01/17/2015. I told him that I would send a note to nurse to see if they were signed off and she would be calling him if they were available. [phone]

## 2015-01-24 NOTE — Telephone Encounter (Signed)
appt made for 03/22/15 with [facility]

## 2015-01-24 NOTE — Telephone Encounter (Signed)
Please call pt. His stomach Bx shows mild gastritis.   CONTINUE PROTONIX. TAKE 30 MINUTES PRIOR TO MEALS TWICE DAILY.  FOLLOW A LOW FAT DIET.   FOLLOW UP IN 3 MOS E30 GASTRITIS/DYSPHAGIA.

## 2015-01-24 NOTE — Telephone Encounter (Signed)
Routing to Dr. Fields for results.  

## 2015-01-25 NOTE — Telephone Encounter (Signed)
Pt is aware of results. 

## 2015-01-30 ENCOUNTER — Telehealth: Payer: Self-pay | Admitting: Gastroenterology

## 2015-01-30 NOTE — Telephone Encounter (Signed)
SEE TC APR 12.

## 2015-03-01 ENCOUNTER — Ambulatory Visit: Payer: Self-pay | Admitting: Gastroenterology

## 2015-03-22 ENCOUNTER — Telehealth: Payer: Self-pay | Admitting: Gastroenterology

## 2015-03-22 ENCOUNTER — Ambulatory Visit (INDEPENDENT_AMBULATORY_CARE_PROVIDER_SITE_OTHER): Payer: Self-pay | Admitting: Gastroenterology

## 2015-03-22 ENCOUNTER — Encounter: Payer: Self-pay | Admitting: Gastroenterology

## 2015-03-22 ENCOUNTER — Other Ambulatory Visit: Payer: Self-pay

## 2015-03-22 VITALS — BP 99/66 | HR 67 | Temp 98.1°F | Ht 70.0 in | Wt 133.4 lb

## 2015-03-22 DIAGNOSIS — B1710 Acute hepatitis C without hepatic coma: Secondary | ICD-10-CM

## 2015-03-22 DIAGNOSIS — R1013 Epigastric pain: Secondary | ICD-10-CM

## 2015-03-22 DIAGNOSIS — R1314 Dysphagia, pharyngoesophageal phase: Secondary | ICD-10-CM

## 2015-03-22 MED ORDER — ONDANSETRON HCL 4 MG PO TABS: 4.0000 mg | ORAL_TABLET | Freq: Three times a day (TID) | ORAL | Status: AC

## 2015-03-22 NOTE — Telephone Encounter (Signed)
[facility]. [facility] said they have not seen patient since1/10/ 2010.  I had requested recent labs, CXR, Hep CRNA from both places.

## 2015-03-22 NOTE — Progress Notes (Signed)
REVIEWED-NO ADDITIONAL RECOMMENDATIONS. 

## 2015-03-22 NOTE — Progress Notes (Signed)
[facility] Provider: [facility] [place]  Primary GI: Dr. Oneida Alar   Chief Complaint  Patient presents with  . Follow-up    HPI:   Anthony Larson is a 39 y.o. 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 Was hospitalized secondary to significantly elevated LFTs and pain. With supportive measures, improved and was discharged. LFTs slowly trending downward. Will need repeat HCV RNA in May 2016 to document for resolving/resolution of virus. Referred to Pain Management due to chronic abdominal pain. EGD recently completed in April 2016 due to dysphagia. 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 Gastritis but negative H.pylori.   [facility] yesterday evening. Labs drawn in April, states LFTs were still high. States chronic headache, pressure. Burning in abdomen. Even with drinking coffee. Occasional N/V. In mornings will drink coffee, smoke cigarette, has large amounts of phlegm coming up. Got Hep A vaccination recently. Still needs Hep B but didn't have vaccination available at [facility]. Weight up 3 lbs from March 2016. Notes early satiety. Sometimes epigastric pain worse with eating. Protonix BID. Outside labs reviewed and noted below.   Past Medical History  Diagnosis Date  . Hepatitis C infection Feb 2016  . GERD (gastroesophageal reflux disease)     Past Surgical History  Procedure Laterality Date  . Finger surgery      left hand middle finger  . Esophagogastroduodenoscopy (egd) with propofol N/A 01/17/2015    Dr. Oneida Alar: 1. 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 MIld - Non-erosive gastritis. Savary dilation. negative H.pylori  . Esophageal dilation N/A 01/17/2015    Procedure: ESOPHAGEAL DILATION WIRE GUIDED WITH SAVARY DILATORS (12MM, 12.8MM, 14MM, 15MM, 16MM);  Surgeon: Danie Binder, MD;  Location: [facility];  Service: Endoscopy;  Laterality: N/A;  . Esophageal biopsy  01/17/2015    Procedure: BIOPSY;  Surgeon: Danie Binder, MD;  Location: [facility];  Service: Endoscopy;;    Current Outpatient Prescriptions  Medication Sig Dispense Refill  . pantoprazole (PROTONIX) 40 MG tablet Take 1 tablet (40 mg total) by mouth 2 (two) times daily before a meal. 60 tablet 3  . sucralfate (CARAFATE) 1 GM/10ML suspension Take 10 mLs (1 g total) by mouth 4 (four) times daily. 420 mL 1   No current facility-administered medications for this visit.    Allergies as of 03/22/2015  . (No Known Allergies)    Family History  Problem Relation Age of Onset  . Prostate cancer Father   . Colon cancer Neg Hx   . Throat cancer Mother     History   Social History  . Marital Status: Married    Spouse Name: N/A  . Number of Children: N/A  . Years of Education: N/A   Social History Main Topics  . Smoking status: Current Some Day Smoker -- 0.50 packs/day for 25 years    Types: Cigarettes  . Smokeless tobacco: Not on file  . Alcohol Use: 0.0 oz/week    0 Standard drinks or equivalent per week     Comment: history of ETOH abuse  . Drug Use: Yes    Special: Marijuana     Comment: marijuana, no IV drug use  . Sexual Activity: Not on file   Other Topics Concern  . None   Social History Narrative    Review of Systems: As mentioned in HPI.   Physical Exam: BP 99/66 mmHg  Pulse 67  Temp(Src) 98.1 F (36.7 C)  Ht _0  (1.778 m)  Wt 133 lb 6.4 oz (60.51 kg)  BMI 19.14 kg/m2 General:   Alert and oriented. No distress noted. Pleasant and cooperative. Appears chronically ill. Thin.  Head:  Normocephalic and atraumatic. Eyes:  Conjuctiva clear without scleral icterus. Abdomen:  +BS, soft, TTP LUQ/epigastric and non-distended. No rebound or guarding. No HSM or masses noted. Msk:  Symmetrical without gross deformities. Normal posture. Extremities:  Without edema. Neurologic:  Alert and  oriented x4;  grossly normal neurologically. Psych:  Alert and cooperative. Normal mood and affect.  Labs April 2016:  Tbili  0.9, Alk Phos 157, AST 192, ALT 233, WBC 11, Hgb 14.6, Hct 41.8, Plts 296  Lab Results  Component Value Date   ALT 52 12/29/2014   AST 38* 12/29/2014   ALKPHOS 201* 12/29/2014   BILITOT 2.9* 12/29/2014

## 2015-03-22 NOTE — Telephone Encounter (Signed)
REVIEWED-NO ADDITIONAL RECOMMENDATIONS. 

## 2015-03-22 NOTE — Assessment & Plan Note (Signed)
Likely multifactorial in setting of Hep C, gastritis. Needs to follow gastritis diet, change Protonix to Dexilant, Zofran for supportive measures, check US abdomen now to assess for gallstones, check lipase now. [facility]. Patient does not appear acutely ill at time of visit. Referral again to Pain Management due to chronic pain.

## 2015-03-22 NOTE — Telephone Encounter (Signed)
[facility] and placed in [facility] office.

## 2015-03-22 NOTE — Patient Instructions (Addendum)
I would like you to try Dexilant once daily for reflux. Stop Protonix and see how you do. If you like this, we can try to get patient assistance for you.  I have sent in nausea medication to take 30 minutes before your meals.   Please have blood work done.   We have scheduled an ultrasound of your gallbladder.   I am trying to get you set up with Pain management.  Follow the reflux diet.   Food Choices for Gastroesophageal Reflux Disease When you have gastroesophageal reflux disease (GERD), the foods you eat and your eating habits are very important. Choosing the right foods can help ease the discomfort of GERD. WHAT GENERAL GUIDELINES DO I NEED TO FOLLOW?  Choose fruits, vegetables, whole grains, low-fat dairy products, and low-fat meat, fish, and poultry.  Limit fats such as oils, salad dressings, butter, nuts, and avocado.  Keep a food diary to identify foods that cause symptoms.  Avoid foods that cause reflux. These may be different for different people.  Eat frequent small meals instead of three large meals each day.  Eat your meals slowly, in a relaxed setting.  Limit fried foods.  Cook foods using methods other than frying.  Avoid drinking alcohol.  Avoid drinking large amounts of liquids with your meals.  Avoid bending over or lying down until 2-3 hours after eating. WHAT FOODS ARE NOT RECOMMENDED? The following are some foods and drinks that may worsen your symptoms: Vegetables Tomatoes. Tomato juice. Tomato and spaghetti sauce. Chili peppers. Onion and garlic. Horseradish. Fruits Oranges, grapefruit, and lemon (fruit and juice). Meats High-fat meats, fish, and poultry. This includes hot dogs, ribs, ham, sausage, salami, and bacon. Dairy Whole milk and chocolate milk. Sour cream. Cream. Butter. Ice cream. Cream cheese.  Beverages Coffee and tea, with or without caffeine. Carbonated beverages or energy drinks. Condiments Hot sauce. Barbecue sauce.   Sweets/Desserts Chocolate and cocoa. Donuts. Peppermint and spearmint. Fats and Oils High-fat foods, including [place] fries and potato chips. Other Vinegar. Strong spices, such as black pepper, white pepper, red pepper, cayenne, curry powder, cloves, ginger, and chili powder. The items listed above may not be a complete list of foods and beverages to avoid. Contact your dietitian for more information. Document Released: 09/30/2005 Document Revised: 10/05/2013 Document Reviewed: 08/04/2013 [facility] Patient Information 2015 [employer] [place]. This information is not intended to replace advice given to you by your health care provider. Make sure you discuss any questions you have with your health care provider.

## 2015-03-22 NOTE — Assessment & Plan Note (Signed)
39 year old male with history of likely acute Hep C, hospitalized in Feb 2016 to follow LFT trend, improving with supportive measures. Genotype 3, with need for recheck of HCV RNA now to document if any eradication. If unable to clear virus on own, will need treatment. Outside labs from April reviewed and noted to have improving bilirubin, transaminases fluctuating. Requesting recent labs from [facility].

## 2015-03-22 NOTE — Assessment & Plan Note (Signed)
Improved s/p dilation.  

## 2015-03-23 LAB — HEPATITIS C RNA QUANTITATIVE
HCV Quantitative Log: 6.12 {Log} — ABNORMAL HIGH (ref <1.18)
HCV Quantitative: 1313817 IU/mL — ABNORMAL HIGH (ref <15)

## 2015-03-23 LAB — LIPASE: Lipase: 14 U/L (ref 0–75)

## 2015-03-27 ENCOUNTER — Ambulatory Visit (HOSPITAL_COMMUNITY)
Admission: RE | Admit: 2015-03-27 | Discharge: 2015-03-27 | Disposition: A | Discharge status: Home / Self Care | Payer: Self-pay | Source: Ambulatory Visit | Attending: Gastroenterology | Admitting: Gastroenterology

## 2015-03-27 DIAGNOSIS — R1013 Epigastric pain: Secondary | ICD-10-CM | POA: Insufficient documentation

## 2015-03-27 DIAGNOSIS — G8929 Other chronic pain: Secondary | ICD-10-CM | POA: Insufficient documentation

## 2015-03-28 NOTE — Progress Notes (Signed)
Quick Note:  Viral load going down. Could consider rechecking again in 3 months to see if he is spontaneously clearing on own. I will need to discuss with Dr. Darrick Penna, as he is a genotype 3 as well. ______

## 2015-03-28 NOTE — Progress Notes (Signed)
Quick Note:  Pt is aware. ______ 

## 2015-03-28 NOTE — Progress Notes (Signed)
Quick Note:  US abdomen normal. ______

## 2015-03-28 NOTE — Progress Notes (Signed)
Quick Note:  LMOM to call. ______ 

## 2015-03-29 NOTE — Progress Notes (Signed)
No pcp

## 2015-04-04 NOTE — Progress Notes (Signed)
Received outside labs dated 03/21/15 from [facility]. AST 49, ALT 61, alk phos 92, ammonia only marginally elevated at 44.  Drug screen positive for benzodiazepines and marijuana. It is known that he smokes marijuana, but there are no BZDs on his med list.

## 2015-05-05 ENCOUNTER — Encounter (HOSPITAL_COMMUNITY): Payer: Self-pay | Admitting: *Deleted

## 2015-05-05 ENCOUNTER — Emergency Department (HOSPITAL_COMMUNITY): Payer: Self-pay

## 2015-05-05 ENCOUNTER — Emergency Department (HOSPITAL_COMMUNITY)
Admission: EM | Admit: 2015-05-05 | Discharge: 2015-05-05 | Disposition: A | Discharge status: Home / Self Care | Payer: Self-pay | Attending: Emergency Medicine | Admitting: Emergency Medicine

## 2015-05-05 DIAGNOSIS — Z8619 Personal history of other infectious and parasitic diseases: Secondary | ICD-10-CM | POA: Insufficient documentation

## 2015-05-05 DIAGNOSIS — Z720 Tobacco use: Secondary | ICD-10-CM | POA: Insufficient documentation

## 2015-05-05 DIAGNOSIS — Z792 Long term (current) use of antibiotics: Secondary | ICD-10-CM | POA: Insufficient documentation

## 2015-05-05 DIAGNOSIS — L03116 Cellulitis of left lower limb: Secondary | ICD-10-CM | POA: Insufficient documentation

## 2015-05-05 DIAGNOSIS — Z79899 Other long term (current) drug therapy: Secondary | ICD-10-CM | POA: Insufficient documentation

## 2015-05-05 DIAGNOSIS — K219 Gastro-esophageal reflux disease without esophagitis: Secondary | ICD-10-CM | POA: Insufficient documentation

## 2015-05-05 LAB — BASIC METABOLIC PANEL
Anion gap: 10 (ref 5–15)
BUN: 7 mg/dL (ref 6–20)
CO2: 29 mmol/L (ref 22–32)
Calcium: 8.9 mg/dL (ref 8.9–10.3)
Chloride: 94 mmol/L — ABNORMAL LOW (ref 101–111)
Creatinine, Ser: 0.83 mg/dL (ref 0.61–1.24)
GFR calc non Af Amer: >60 mL/min (ref >60)
GLUCOSE: 89 mg/dL (ref 65–99)
Potassium: 3.1 mmol/L — ABNORMAL LOW (ref 3.5–5.1)
Sodium: 133 mmol/L — ABNORMAL LOW (ref 135–145)

## 2015-05-05 LAB — CBC WITH DIFFERENTIAL/PLATELET
Basophils Absolute: 0.1 10*3/uL (ref 0.0–0.1)
Basophils Relative: 0 % (ref 0–1)
EOS ABS: 0.2 10*3/uL (ref 0.0–0.7)
Eosinophils Relative: 1 % (ref 0–5)
HCT: 43.8 % (ref 39.0–52.0)
Hemoglobin: 15.4 g/dL (ref 13.0–17.0)
LYMPHS PCT: 29 % (ref 12–46)
Lymphs Abs: 5.4 10*3/uL — ABNORMAL HIGH (ref 0.7–4.0)
MCH: 34.1 pg — ABNORMAL HIGH (ref 26.0–34.0)
MCHC: 35.2 g/dL (ref 30.0–36.0)
MCV: 97.1 fL (ref 78.0–100.0)
MONO ABS: 1.8 10*3/uL — AB (ref 0.1–1.0)
Monocytes Relative: 10 % (ref 3–12)
NEUTROS PCT: 60 % (ref 43–77)
Neutro Abs: 11.4 10*3/uL — ABNORMAL HIGH (ref 1.7–7.7)
Platelets: 261 10*3/uL (ref 150–400)
RBC: 4.51 MIL/uL (ref 4.22–5.81)
RDW: 12.5 % (ref 11.5–15.5)
WBC: 18.8 10*3/uL — AB (ref 4.0–10.5)

## 2015-05-05 LAB — SEDIMENTATION RATE: Sed Rate: 22 mm/hr — ABNORMAL HIGH (ref 0–16)

## 2015-05-05 LAB — C-REACTIVE PROTEIN: CRP: 8.7 mg/dL — ABNORMAL HIGH (ref <1.0)

## 2015-05-05 MED ORDER — HYDROMORPHONE HCL 1 MG/ML IJ SOLN
1.0000 mg | Freq: Once | INTRAMUSCULAR | Status: AC
  Administered 2015-05-05: 1 mg via INTRAVENOUS
  Filled 2015-05-05: qty 1

## 2015-05-05 MED ORDER — MORPHINE SULFATE 4 MG/ML IJ SOLN
4.0000 mg | Freq: Once | INTRAMUSCULAR | Status: AC
  Administered 2015-05-05: 4 mg via INTRAVENOUS
  Filled 2015-05-05: qty 1

## 2015-05-05 MED ORDER — ONDANSETRON HCL 4 MG/2ML IJ SOLN
4.0000 mg | Freq: Once | INTRAMUSCULAR | Status: AC
  Administered 2015-05-05: 4 mg via INTRAVENOUS
  Filled 2015-05-05: qty 2

## 2015-05-05 MED ORDER — CLINDAMYCIN HCL 150 MG PO CAPS: 300.0000 mg | ORAL_CAPSULE | Freq: Four times a day (QID) | ORAL | Status: AC

## 2015-05-05 MED ORDER — SODIUM CHLORIDE 0.9 % IV BOLUS (SEPSIS)
500.0000 mL | Freq: Once | INTRAVENOUS | Status: AC
  Administered 2015-05-05: 500 mL via INTRAVENOUS

## 2015-05-05 MED ORDER — VANCOMYCIN HCL IN DEXTROSE 1-5 GM/200ML-% IV SOLN
1000.0000 mg | Freq: Once | INTRAVENOUS | Status: AC
  Administered 2015-05-05: 1000 mg via INTRAVENOUS
  Filled 2015-05-05: qty 200

## 2015-05-05 NOTE — ED Provider Notes (Signed)
CSN: [id number]     Arrival date & time 05/05/15  1354 History   First MD Initiated Contact with Patient 05/05/15 1410     Chief Complaint  Patient presents with  . Leg Pain     (Consider location/radiation/quality/duration/timing/severity/associated sxs/prior Treatment) HPI Comments: Patient presents to the ER for evaluation of pain and swelling of left knee. Patient reports that symptoms have been present for 4 days. The first day he had some mild stinging in the area of the knee that he had a small pimple formation. He was seen in the ER 2 days ago at [facility] and then again yesterday. He was placed on Bactrim and had incision and drainage performed yesterday. He reports that nothing drained with a opened the area. He is concerned because the redness has extended beyond the lines that were drawn at the other hospital and he is expressing more pain and swelling. He has not had any fever.  Patient is a 39 y.o. male presenting with leg pain.  Leg Pain   Past Medical History  Diagnosis Date  . Hepatitis C infection Feb 2016  . GERD (gastroesophageal reflux disease)    Past Surgical History  Procedure Laterality Date  . Finger surgery      left hand middle finger  . Esophagogastroduodenoscopy (egd) with propofol N/A 01/17/2015    Dr. Darrick Penna: 1. 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 MIld - Non-erosive gastritis. Savary dilation. negative H.pylori  . Esophageal dilation N/A 01/17/2015    Procedure: ESOPHAGEAL DILATION WIRE GUIDED WITH SAVARY DILATORS ( , 12.8MM, , , );  Surgeon: West Bali, MD;  Location: [facility];  Service: Endoscopy;  Laterality: N/A;  . Esophageal biopsy  01/17/2015    Procedure: BIOPSY;  Surgeon: West Bali, MD;  Location: [facility];  Service: Endoscopy;;   Family History  Problem Relation Age of Onset  . Prostate cancer Father   . Colon cancer Neg Hx   . Throat cancer Mother    History  Substance Use Topics  . Smoking status: Current Some Day  Smoker -- 0.50 packs/day for 25 years    Types: Cigarettes  . Smokeless tobacco: Not on file  . Alcohol Use: 0.0 oz/week    0 Standard drinks or equivalent per week     Comment: history of ETOH abuse    Review of Systems  Skin: Positive for color change.  All other systems reviewed and are negative.     Allergies  Tylenol  Home Medications   Prior to Admission medications   Medication Sig Start Date End Date Taking? Authorizing Provider  esomeprazole (NEXIUM) 40 MG capsule Take 40 mg by mouth daily at 12 noon.   Yes Historical Provider, MD  sulfamethoxazole-trimethoprim (BACTRIM DS,SEPTRA DS) 800-160 MG per tablet Take 1 tablet by mouth 2 (two) times daily. starting 05/04/15 x 10   Yes Historical Provider, MD  ondansetron (ZOFRAN) 4 MG tablet Take 1 tablet (4 mg total) by mouth 4 (four) times daily -  with meals and at bedtime. Patient not taking: Reported on 05/05/2015 03/22/15   Nira Retort, NP  oxyCODONE (OXY IR/ROXICODONE) 5 MG immediate release tablet Take 5 mg by mouth every 4 (four) hours as needed for moderate pain or severe pain.    Historical Provider, MD  pantoprazole (PROTONIX) 40 MG tablet Take 1 tablet (40 mg total) by mouth 2 (two) times daily before a meal. Patient not taking: Reported on 05/05/2015 12/29/14   Nira Retort, NP  sucralfate (CARAFATE)  1 GM/10ML suspension Take 10 mLs (1 g total) by mouth 4 (four) times daily. Patient not taking: Reported on 05/05/2015 12/29/14   Nira Retort, NP   BP 95/70 mmHg  Pulse 63  Temp(Src) 99.2 F (37.3 C) (Oral)  Resp 16  Ht 5\' 10"  (1.778 m)  Wt 145 lb (65.772 kg)  BMI 20.81 kg/m2  SpO2 100% Physical Exam  Constitutional: He is oriented to person, place, and time. He appears well-developed and well-nourished. No distress.  HENT:  Head: Normocephalic and atraumatic.  Right Ear: Hearing normal.  Left Ear: Hearing normal.  Nose: Nose normal.  Mouth/Throat: Oropharynx is clear and moist and mucous membranes are normal.   Eyes: Conjunctivae and EOM are normal. Pupils are equal, round, and reactive to light.  Neck: Normal range of motion. Neck supple.  Cardiovascular: Regular rhythm, S1 normal and S2 normal.  Exam reveals no gallop and no friction rub.   No murmur heard. Pulmonary/Chest: Effort normal and breath sounds normal. No respiratory distress. He exhibits no tenderness.  Abdominal: Soft. Normal appearance and bowel sounds are normal. There is no hepatosplenomegaly. There is no tenderness. There is no rebound, no guarding, no tenderness at McBurney's point and negative Murphy's sign. No hernia.  Musculoskeletal: Normal range of motion.  Neurological: He is alert and oriented to person, place, and time. He has normal strength. No cranial nerve deficit or sensory deficit. Coordination normal. GCS eye subscore is 4. GCS verbal subscore is 5. GCS motor subscore is 6.  Skin: Skin is warm, dry and intact. No rash noted. There is erythema. No cyanosis.  Psychiatric: He has a normal mood and affect. His speech is normal and behavior is normal. Thought content normal.  Nursing note and vitals reviewed.   ED Course  Procedures (including critical care time) Labs Review Labs Reviewed  CBC WITH DIFFERENTIAL/PLATELET - Abnormal; Notable for the following:    WBC 18.8 (*)    MCH 34.1 (*)    Neutro Abs 11.4 (*)    Lymphs Abs 5.4 (*)    Monocytes Absolute 1.8 (*)    All other components within normal limits  CULTURE, BLOOD (ROUTINE X 2)  CULTURE, BLOOD (ROUTINE X 2)  BASIC METABOLIC PANEL  SEDIMENTATION RATE  C-REACTIVE PROTEIN    Imaging Review Dg Knee Complete 4 Views Left  05/05/2015   CLINICAL DATA:  Left knee pain and swelling starting Monday  EXAM: LEFT KNEE - COMPLETE 4+ VIEW  COMPARISON:  None.  FINDINGS: Four views of the left knee submitted. No acute fracture or subluxation there is no joint effusion. Soft tissue swelling suprapatellar region.  IMPRESSION: No acute fracture or subluxation. Soft  tissue swelling suprapatellar region.   Electronically Signed   By: Natasha Mead M.D.   On: 05/05/2015 15:26     EKG Interpretation None      MDM   Final diagnoses:  None  cellulitis  Patient presents to the ER for evaluation of infection of the left leg. Patient started with a small bump above the left knee which has become an area of diffuse erythema and swelling. He has been on Bactrim after having been evaluated in another emergency department each of the last 2 days. He also had incision and drainage performed. Area shows diffuse erythema and warmth. Patient became concerned because he thinks that the infection is spreading. The erythema is 1-2 cm beyond the outline placed at the outside institution. This is not a significant amount of spreading. I do  not palpate any effusion in the knee joint. I did perform beside ultrasound of the area and I do not see any fluid collections to suggest bursitis or abscess.  Examination is consistent with soft tissue infection, simple cellulitis. There is no concern for knee joint infection at this time based on examination. Patient given IV vancomycin here in the ER, will be maintained as an outpatient on oral Bactrim, add 7 days of clinda. Return for worsening infection.    Gilda Crease, MD 05/05/15 [phone]

## 2015-05-05 NOTE — ED Notes (Signed)
Pt states swelling noted Tuesday. Felt stinging to the area on Monday. Seen at [facility] ER Wed. And Thurs. States redness spread outside the area which was previously marked.

## 2015-05-05 NOTE — Discharge Instructions (Signed)

## 2015-05-10 LAB — CULTURE, BLOOD (ROUTINE X 2)
CULTURE: NO GROWTH
Culture: NO GROWTH

## 2015-05-15 NOTE — Progress Notes (Signed)
Quick Note:  We need to check HCV RNA early September. If this is still positive, we will discuss possible treatment as long as no contraindications. ______

## 2015-05-17 ENCOUNTER — Other Ambulatory Visit: Payer: Self-pay

## 2015-05-17 DIAGNOSIS — B1920 Unspecified viral hepatitis C without hepatic coma: Secondary | ICD-10-CM

## 2015-05-17 NOTE — Progress Notes (Signed)
Quick Note:  PT is aware and lab order on file to be mailed. ______

## 2015-06-01 ENCOUNTER — Telehealth: Payer: Self-pay

## 2015-06-01 MED ORDER — POLYETHYLENE GLYCOL 3350 17 GM/SCOOP PO POWD: ORAL | Status: AC

## 2015-06-01 NOTE — Telephone Encounter (Signed)
Start miralax 17g BID for three days then daily as needed for constipation. Call if abdominal pain does not subside or constipation not managed.  Save for Gerrit Halls, NP for further review and/or recommendations.   RX sent.

## 2015-06-01 NOTE — Addendum Note (Signed)
Addended by: Tiffany Kocher on: 06/01/2015 04:29 PM   Modules accepted: Orders

## 2015-06-01 NOTE — Telephone Encounter (Signed)
Pt called and said he has had left side pain that radiates to his back. He had an episode on Friday night that felt like he jumped out of his body and back into it. He has not had a good Bm since last Friday, just a little a couple of times. Routing to Tana Coast, PA in the absence of Gerrit Halls, NP who saw the pt in the office.

## 2015-06-01 NOTE — Telephone Encounter (Signed)
PT is aware.

## 2015-06-02 NOTE — Telephone Encounter (Signed)
Noted and agree. 

## 2015-06-20 ENCOUNTER — Telehealth: Payer: Self-pay | Admitting: Gastroenterology

## 2015-06-20 ENCOUNTER — Other Ambulatory Visit: Payer: Self-pay

## 2015-06-20 DIAGNOSIS — B1920 Unspecified viral hepatitis C without hepatic coma: Secondary | ICD-10-CM

## 2015-06-20 NOTE — Telephone Encounter (Signed)
requested

## 2015-06-20 NOTE — Addendum Note (Signed)
Addended by: Lavena Bullion on: 06/20/2015 10:53 AM   Modules accepted: Medications

## 2015-06-20 NOTE — Telephone Encounter (Signed)
Patient states that he had several labs drawn in [place] at the hospital the 25 and 28 of august  And has new medications.  He signed a release in case we need to get the labs from [place] [phone]

## 2015-06-20 NOTE — Telephone Encounter (Signed)
Pt was having trouble getting his labs at [place] and [place] spoke to them. I had to to put the order in again for the Hep C RNA quantitative for STAT so the lab would do today. Pt has the order.  He left a list of new meds from [facility].  Minocycline100 mg   One every 12 hours for 5 days Cephalexin 500 mg   Every 6 hours for 10 days Prednisone 20 mg     Take 2 tablets daily Hydroxyzine HCL 25 mg  As needed Q-Dryl 25 mg   Take 2 tablets every 6 hours  I will add these to the med list.

## 2015-06-20 NOTE — Telephone Encounter (Signed)
Routing to Gerrit Halls, NP and also to Darl Pikes to request the notes and test results from his visit to [facility].

## 2015-06-22 LAB — HEPATITIS C RNA QUANTITATIVE
HCV QUANT: 1731345 [IU]/mL — AB (ref <15)
HCV Quantitative Log: 6.24 {Log} — ABNORMAL HIGH (ref <1.18)

## 2015-06-28 ENCOUNTER — Other Ambulatory Visit: Payer: Self-pay

## 2015-06-28 NOTE — Telephone Encounter (Signed)
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 Needs referral to Annamarie Major with [facility] in [place] for evaluation.

## 2015-06-28 NOTE — Telephone Encounter (Signed)
Called pt and number has been disconnected.

## 2015-06-28 NOTE — Telephone Encounter (Signed)
Referral made 

## 2015-06-28 NOTE — Telephone Encounter (Signed)
Called pt wife and was able to speak to pt. He is aware of results and of the referral to [facility] Clinic

## 2015-08-02 NOTE — Telephone Encounter (Signed)
CONTACT PT TO [facility] 

## 2015-08-03 NOTE — Telephone Encounter (Signed)
REVIEWED-NO ADDITIONAL RECOMMENDATIONS. 

## 2015-08-03 NOTE — Telephone Encounter (Signed)
I talked with the wife and she stated that [place] [facility]. His appointment is No 28th of 29th.

## 2016-12-01 IMAGING — CT CT ABD-PELV W/ CM
2 of 5 series · 16 of 46 positions shown, 18 images · IV contrast (Omnipaque 300)
Comparison: None.

CLINICAL DATA: Elevated liver function tests, abdominal pain,
jaundice

EXAM:
CT ABDOMEN AND PELVIS WITH CONTRAST
TECHNIQUE: Multidetector CT imaging of the abdomen and pelvis was performed
using the standard protocol following bolus administration of
intravenous contrast.
CONTRAST:  100mL OMNIPAQUE IOHEXOL 300 MG/ML  SOLN

[Series 2: abd_pel_with 5.0 b40f · axial · 0.65mm/px · z∈[-472,-97]mm · 13 of 87 slices shown, 15 images]
[im 6/87  soft-tissue]
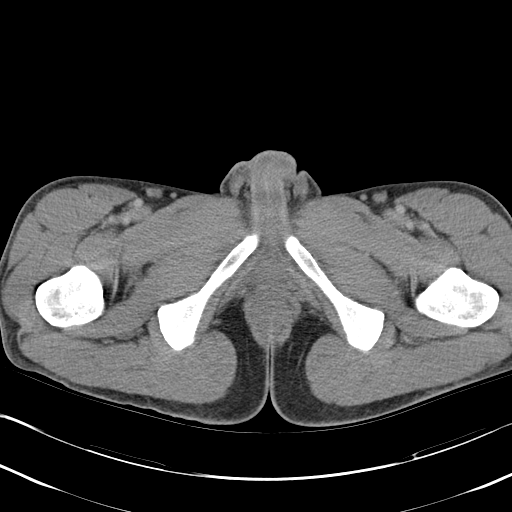
[im 6/87  bone]
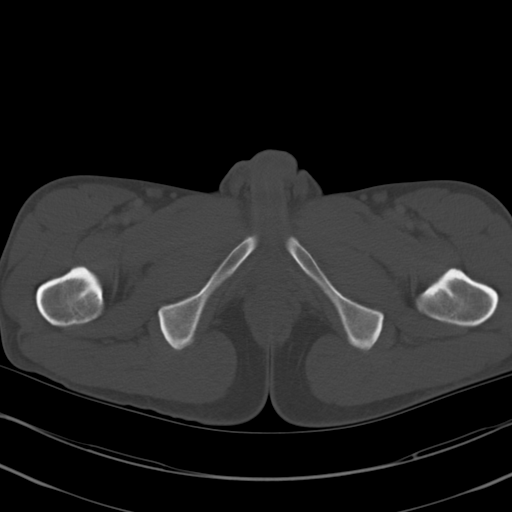
[im 11/87  soft-tissue]
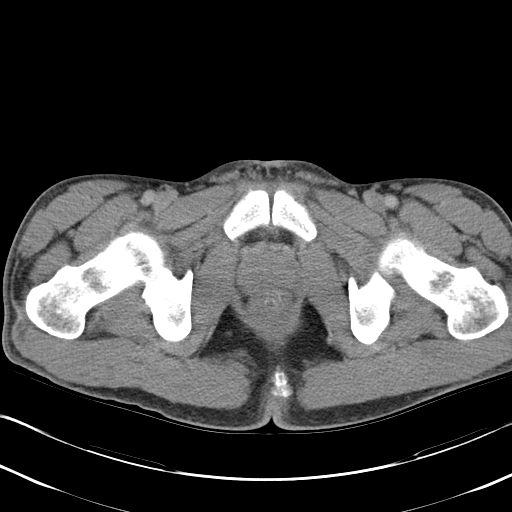
[im 21/87  soft-tissue]
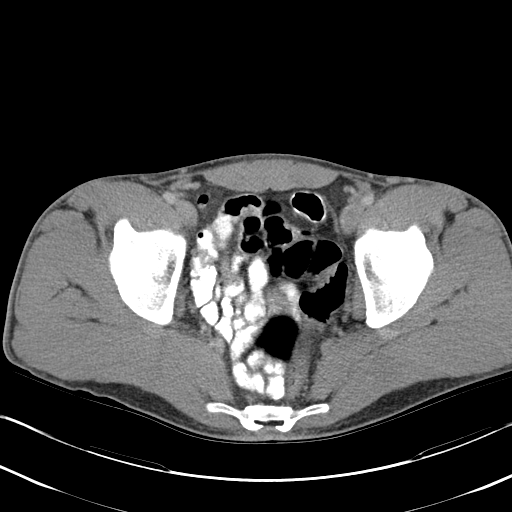
[im 26/87  soft-tissue]
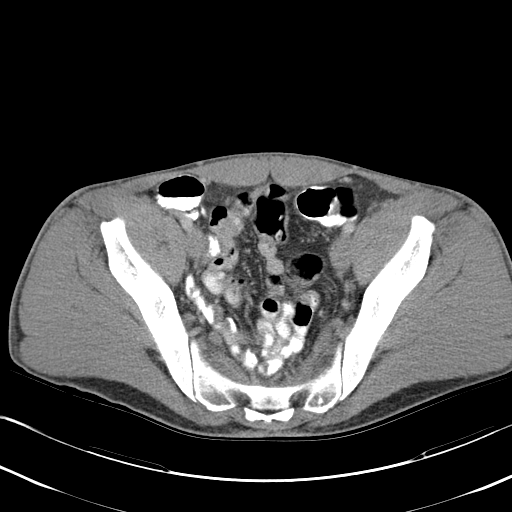
[im 31/87  soft-tissue]
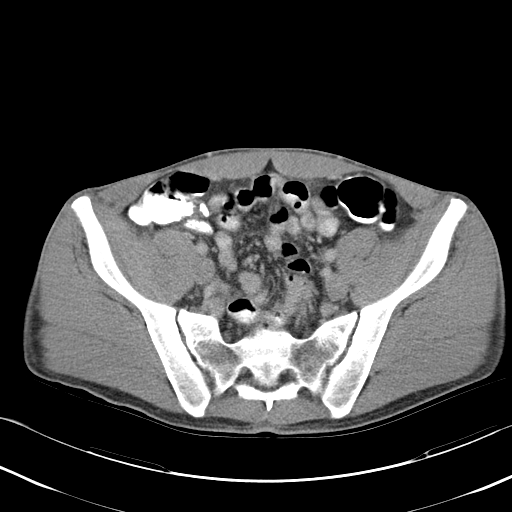
[im 36/87  soft-tissue]
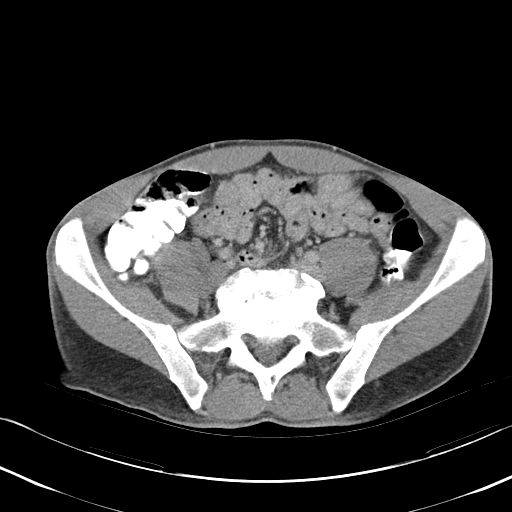
[im 46/87  soft-tissue]
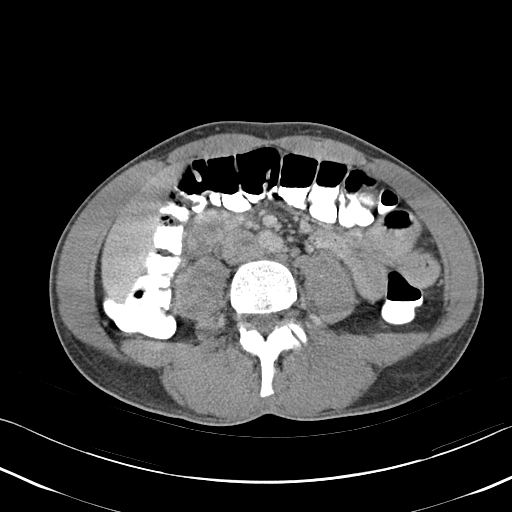
[im 51/87  soft-tissue]
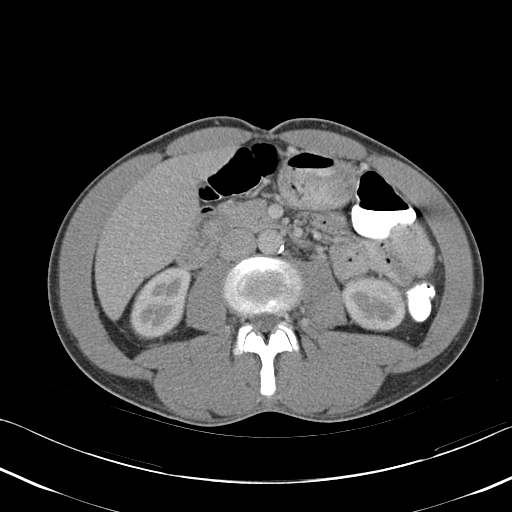
[im 56/87  soft-tissue]
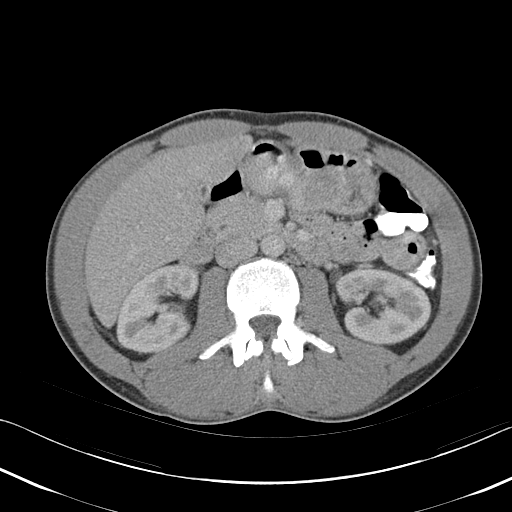
[im 56/87  bone]
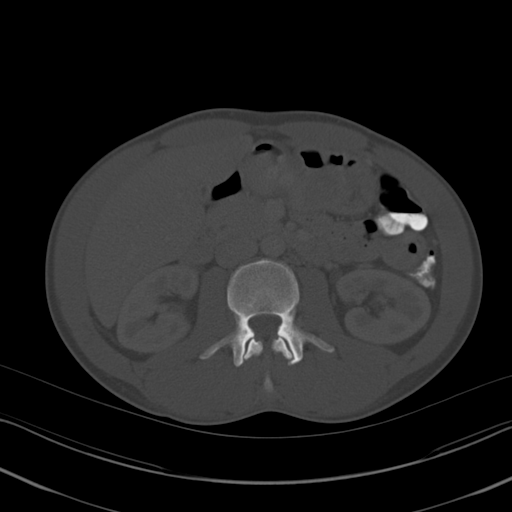
[im 61/87  soft-tissue]
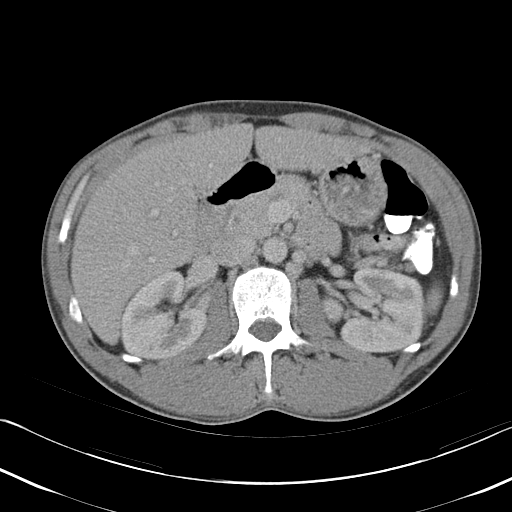
[im 66/87  soft-tissue]
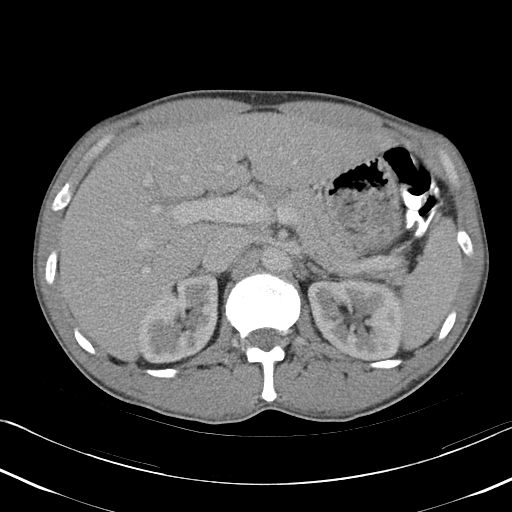
[im 76/87  soft-tissue]
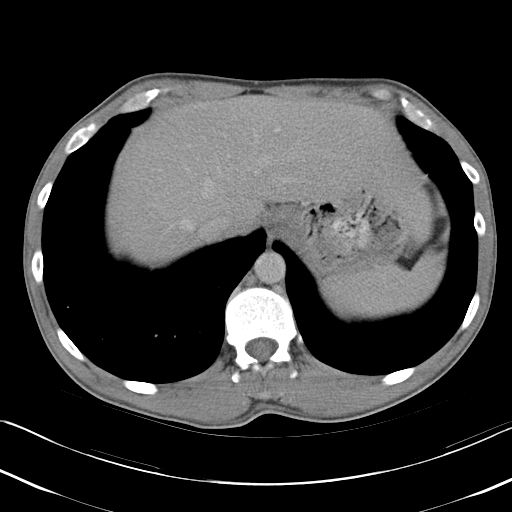
[im 81/87  soft-tissue]
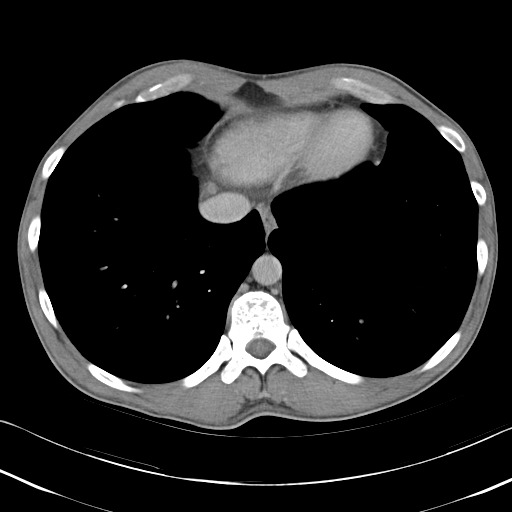

[Series 4: abd_pel_with 3.0 spo cor · coronal · 0.64mm/px · 3 of 75 slices shown]
[im 25/75  soft-tissue]
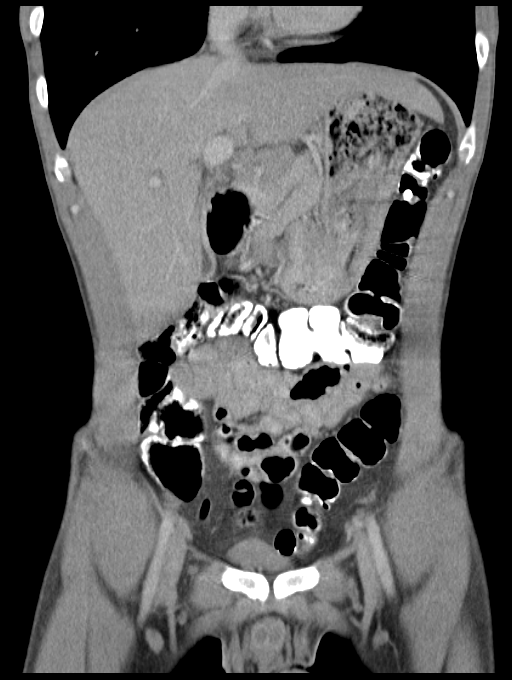
[im 33/75  soft-tissue]
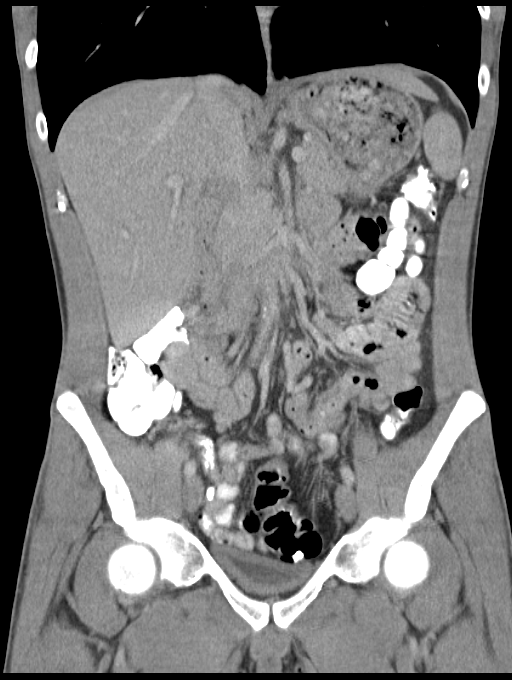
[im 42/75  soft-tissue]
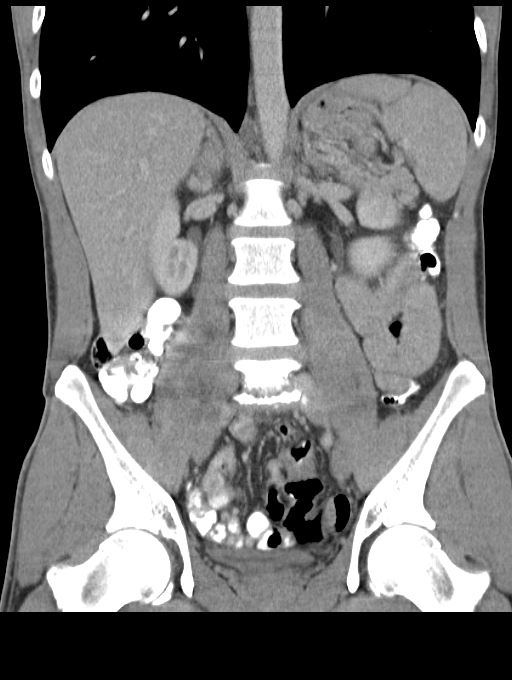

[16 of 46 positions shown; findings below may reference images not displayed]

FINDINGS: The lung bases are clear. The liver enhances with no focal
abnormality and no ductal dilatation is seen. There is however some
periportal edema this is a nonspecific finding but can be seen with
hepatitis, congestive heart failure, or lymphatic obstruction in the
porta hepatis. The portal vein appears to opacified. No focal
hepatic abnormality is seen. The gallbladder is contracted and no
calcified gallstones are seen. The pancreas appears normal in size
and the pancreatic duct is not dilated. The peripancreatic fat
planes are relatively well preserved with no evidence of
pancreatitis. The adrenal glands and spleen are unremarkable. The
stomach is filled with food debris. The kidneys enhance with no
calculus or mass and no hydronephrosis is seen. On delayed images
the pelvocaliceal systems are unremarkable and the proximal ureters
are normal in caliber. The abdominal aorta is normal in caliber. A
few lymph nodes are present in the epigastrium and porta hepatis
which could be reactive.

The urinary bladder is decompressed and appears slightly thick
walled. The prostate is within normal limits in size. The terminal
ileum and the appendix are unremarkable. There is degenerative disc
disease at the L5-S1 level noted.
IMPRESSION: 1. Periportal edema which is a nonspecific finding, but can be seen
with hepatitis, congestive heart failure, or obstruction to the
lymphatic drainage from the porta hepatis by adenopathy. Correlate
clinically.
2. Small nodes in the porta hepatis and epigastrium could be
reactive.
3. No gallstones are evident.
4. Degenerative disc disease at L5-S1

## 2017-02-23 IMAGING — US US ABDOMEN LIMITED
1 series · 14 of 25 positions shown · non-contrast
Comparison: None.

CLINICAL DATA: Chronic epigastric pain; history of hepatitis-C

EXAM:
US ABDOMEN LIMITED - RIGHT UPPER QUADRANT

[Series 1: us abdomen limited · 0.14mm/px · 14 of 73 slices shown]
[im 1/73]
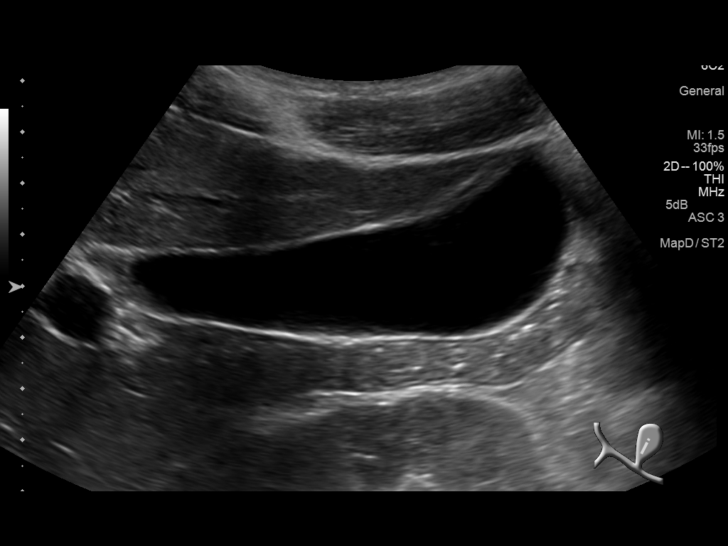
[im 7/73]
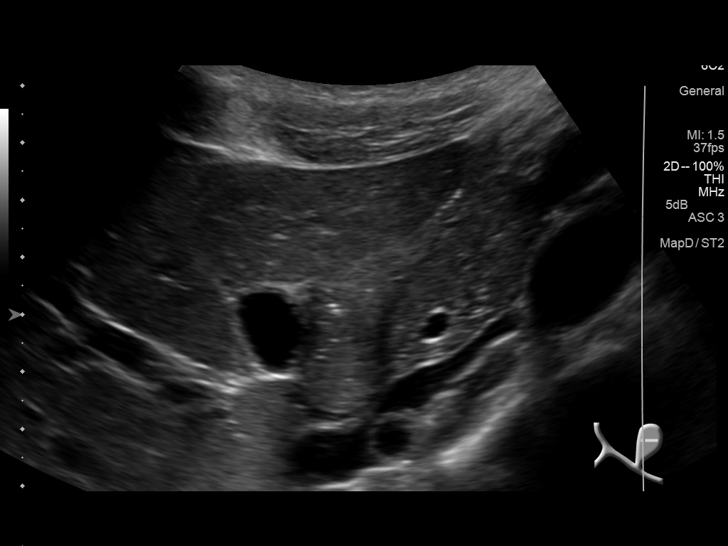
[im 13/73]
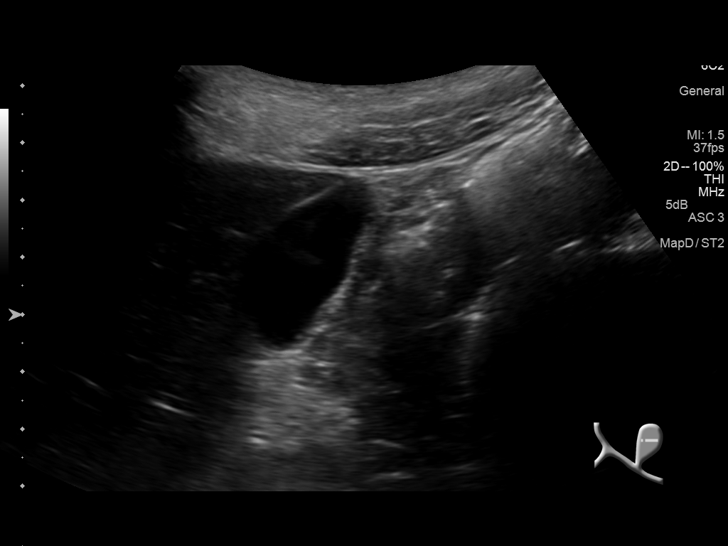
[im 19/73]
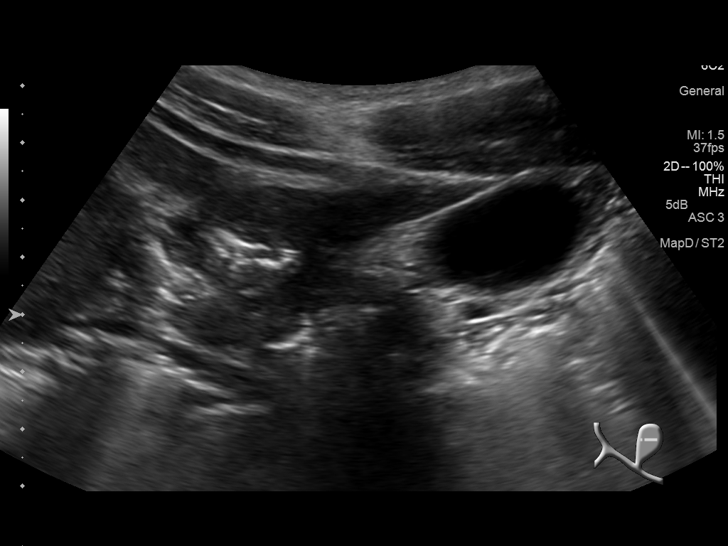
[im 25/73]
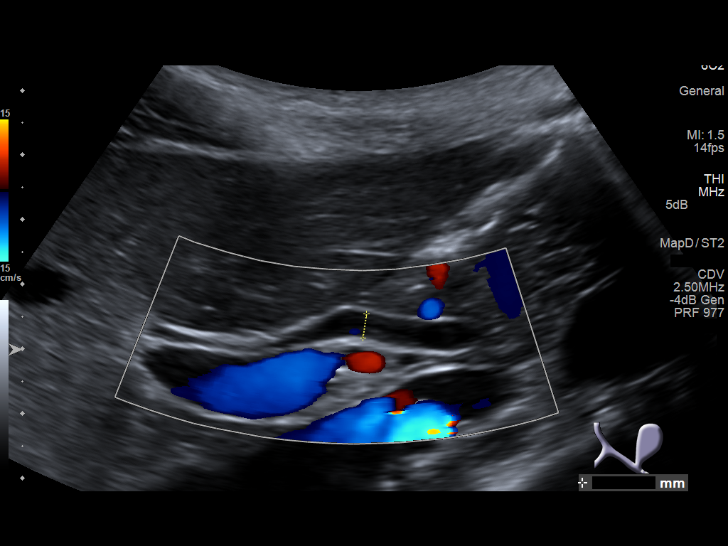
[im 28/73]
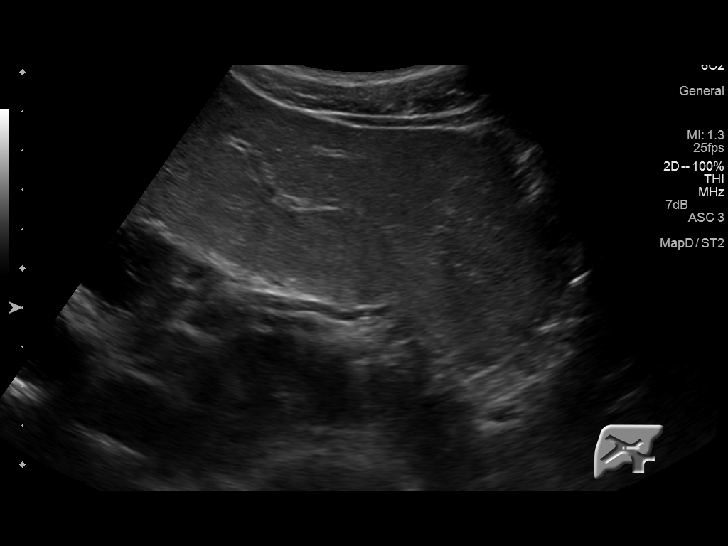
[im 34/73]
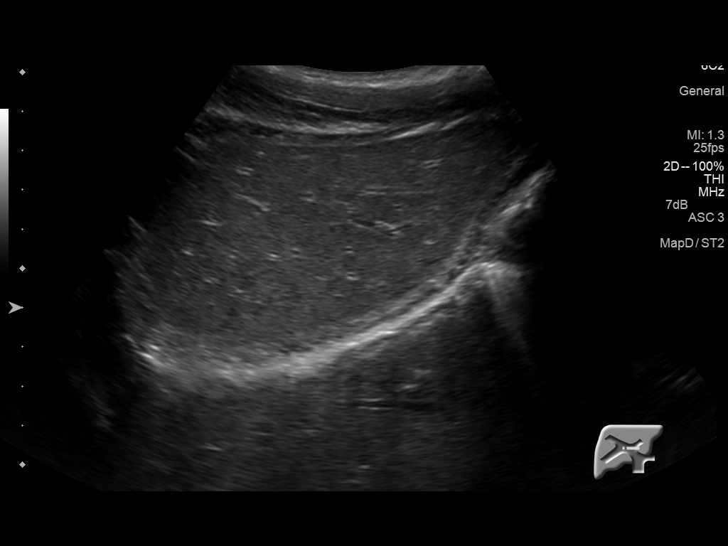
[im 40/73]
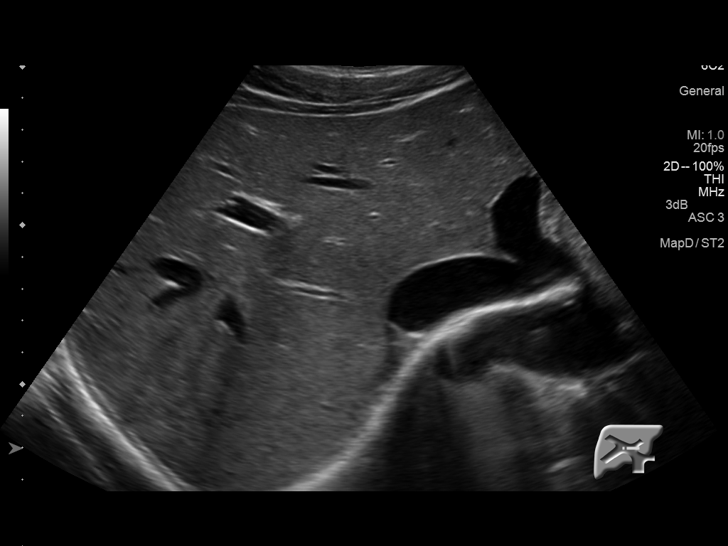
[im 46/73]
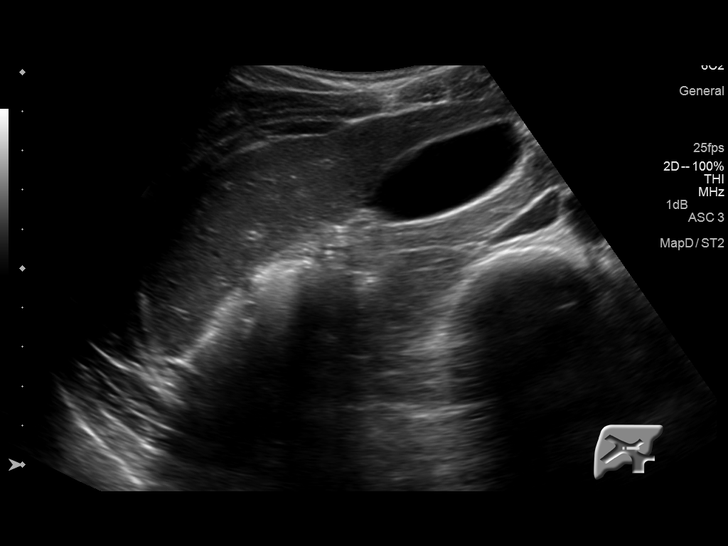
[im 49/73]
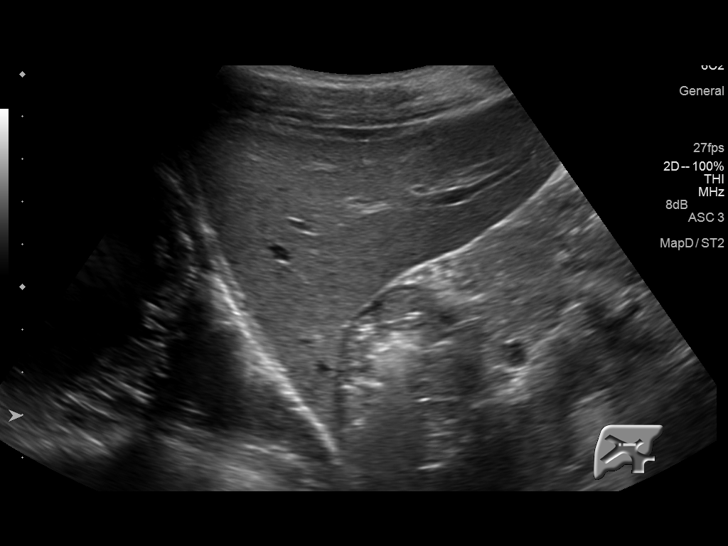
[im 55/73]
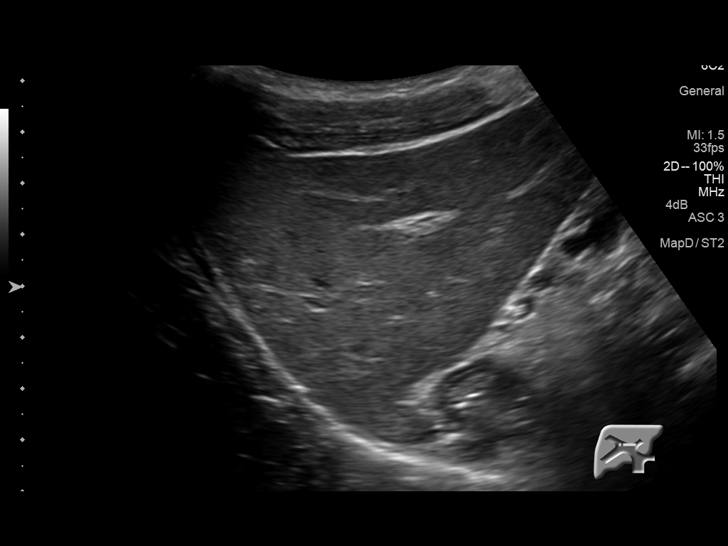
[im 61/73]
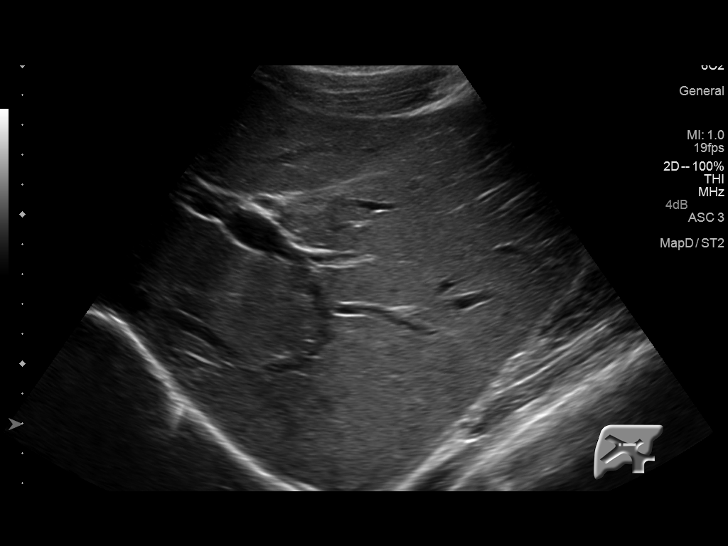
[im 67/73]
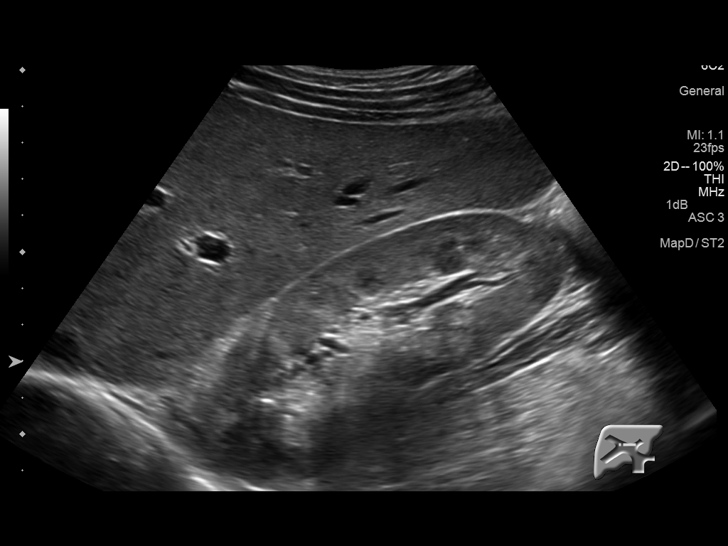
[im 73/73]
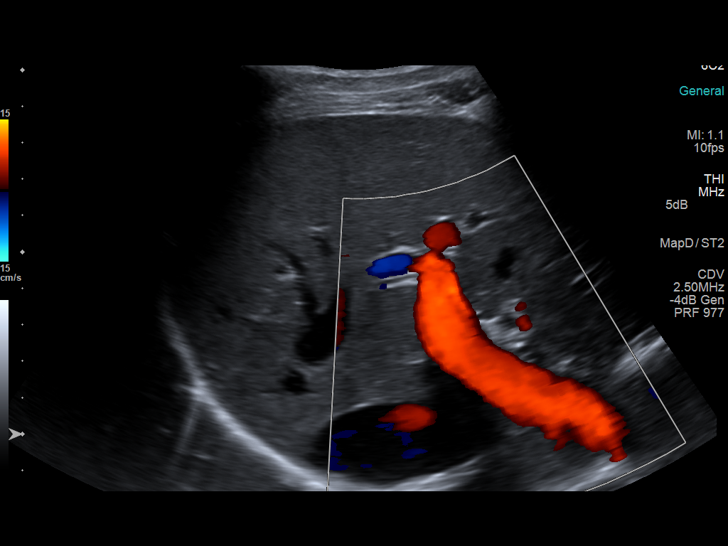

[14 of 25 positions shown; findings below may reference images not displayed]

FINDINGS: Gallbladder:

No gallstones or wall thickening visualized. No pericholecystic
fluid. No sonographic Murphy sign noted.

Common bile duct:

Diameter: 4 mm. There is no intrahepatic or extrahepatic biliary
duct dilatation.

Liver:

No focal lesion identified. Within normal limits in parenchymal
echogenicity.
IMPRESSION: Study within normal limits.

## 2018-08-06 NOTE — Progress Notes (Signed)
REVIEWED-NO ADDITIONAL RECOMMENDATIONS. 

## 2023-04-14 DEATH — deceased
